# Patient Record
Sex: Female | Born: 1970
Health system: Southern US, Community
[De-identification: ages and names within clinical notes are randomized; demographics above are authoritative.]

## PROBLEM LIST (undated history)

## (undated) DIAGNOSIS — D251 Intramural leiomyoma of uterus: Secondary | ICD-10-CM

## (undated) DIAGNOSIS — D649 Anemia, unspecified: Secondary | ICD-10-CM

## (undated) DIAGNOSIS — N920 Excessive and frequent menstruation with regular cycle: Secondary | ICD-10-CM

## (undated) HISTORY — PX: TUBAL LIGATION: SHX77

---

## 2001-12-22 ENCOUNTER — Emergency Department (HOSPITAL_COMMUNITY): Admission: EM | Admit: 2001-12-22 | Discharge: 2001-12-23 | Payer: Self-pay | Admitting: *Deleted

## 2008-11-22 ENCOUNTER — Emergency Department (HOSPITAL_COMMUNITY): Admission: EM | Admit: 2008-11-22 | Discharge: 2008-11-22 | Payer: Self-pay | Admitting: Emergency Medicine

## 2009-03-08 ENCOUNTER — Emergency Department: Payer: Self-pay | Admitting: Emergency Medicine

## 2009-06-18 ENCOUNTER — Emergency Department: Payer: Self-pay | Admitting: Emergency Medicine

## 2013-07-03 ENCOUNTER — Emergency Department: Payer: Self-pay | Admitting: Emergency Medicine

## 2016-11-25 ENCOUNTER — Encounter (HOSPITAL_COMMUNITY): Payer: Self-pay

## 2016-11-25 ENCOUNTER — Emergency Department (HOSPITAL_COMMUNITY)
Admission: EM | Admit: 2016-11-25 | Discharge: 2016-11-25 | Disposition: A | Discharge status: Home / Self Care | Payer: PRIVATE HEALTH INSURANCE | Attending: Emergency Medicine | Admitting: Emergency Medicine

## 2016-11-25 DIAGNOSIS — Z7721 Contact with and (suspected) exposure to potentially hazardous body fluids: Secondary | ICD-10-CM | POA: Insufficient documentation

## 2016-11-25 DIAGNOSIS — W461XXA Contact with contaminated hypodermic needle, initial encounter: Secondary | ICD-10-CM | POA: Diagnosis not present

## 2016-11-25 DIAGNOSIS — S61432A Puncture wound without foreign body of left hand, initial encounter: Secondary | ICD-10-CM | POA: Diagnosis not present

## 2016-11-25 NOTE — Discharge Instructions (Signed)
[facility] for further management of your recent needlestick injury

## 2016-11-25 NOTE — ED Triage Notes (Signed)
[facility]. Stuck on top of right hand, NAD.

## 2016-11-25 NOTE — ED Notes (Signed)
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 Sent to ED for treatment.

## 2016-11-25 NOTE — ED Provider Notes (Signed)
[facility] Provider Note   CSN: [id number] Arrival date & time: 11/25/16  0957     History   Chief Complaint No chief complaint on file.   HPI Katie Padilla is a 46 y.o. female.  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 Patient was pulling out trash from the trash bin and an uncapped insulin needle stuck to top of her R dominant hand as she was turning. She felt a slight pinch.  Then discovered the needle.  She did draw a small drop of blood after careful inspection of her hand.  Incident happened 4 hrs ago.  She is UTD with tetanus.  Patient states she was told by her supervisor to follow-up at urgent care for further management. She mentioned at urgent care is not open, therefore she is here in the ER. She denies any significant pain. She has no other complaint.   History reviewed. No pertinent past medical history.  There are no active problems to display for this patient.   History reviewed. No pertinent surgical history.  OB History    No data available       Home Medications    Prior to Admission medications   Not on File    Family History No family history on file.  Social History Social History  Substance Use Topics  . Smoking status: Never Smoker  . Smokeless tobacco: Never Used  . Alcohol use Not on file     Allergies   Patient has no known allergies.   Review of Systems Review of Systems  Constitutional: Negative for fever.  Skin: Positive for wound.  Neurological: Negative for numbness.     Physical Exam Updated Vital Signs BP 130/77 (BP Location: Left Arm)   Pulse 64   Temp 98.1 F (36.7 C) (Oral)   Resp 16   SpO2 100%   Physical Exam  Constitutional: She appears well-developed and well-nourished. No distress.  HENT:  Head: Atraumatic.  Eyes: Conjunctivae are normal.  Neck: Neck supple.  Neurological: She is alert.    Skin: No rash noted.  R hand: on the dorsum of the hand there is a 1mm superficial puncture wound without tenderness or erythema.    Psychiatric: She has a normal mood and affect.  Nursing note and vitals reviewed.    ED Treatments / Results  Labs (all labs ordered are listed, but only abnormal results are displayed) Labs Reviewed - No data to display  EKG  EKG Interpretation None       Radiology No results found.  Procedures Procedures (including critical care time)  Medications Ordered in ED Medications - No data to display   Initial Impression / Assessment and Plan / ED Course  I have reviewed the triage vital signs and the nursing notes.  Pertinent labs & imaging results that were available during my care of the patient were reviewed by me and considered in my medical decision making (see chart for details).     BP 130/77 (BP Location: Left Arm)   Pulse 64   Temp 98.1 F (36.7 C) (Oral)   Resp 16   SpO2 100%    Final Clinical Impressions(s) / ED Diagnoses   Final diagnoses:  Accidental needlestick injury with exposure to body fluid    New Prescriptions New Prescriptions   No medications on file   10:54 AM Pt suffered a needlestick injury to  her R hand.  I have reached out to our Charge Nurse who was aware of pt and was given recommendation for pt to f/u at [facility] on Monday at 8am for further management.  No blood draws indicated while in the ER.  No further treatment indicated at this time.  Pt made aware and agrees.    Fayrene Helper, PA-C 11/25/16 1056    Arby Barrette, MD 11/25/16 1101

## 2017-06-27 DIAGNOSIS — Z1389 Encounter for screening for other disorder: Secondary | ICD-10-CM | POA: Diagnosis not present

## 2017-06-27 DIAGNOSIS — M25561 Pain in right knee: Secondary | ICD-10-CM | POA: Diagnosis not present

## 2017-06-27 DIAGNOSIS — N938 Other specified abnormal uterine and vaginal bleeding: Secondary | ICD-10-CM | POA: Diagnosis not present

## 2017-06-27 DIAGNOSIS — I10 Essential (primary) hypertension: Secondary | ICD-10-CM | POA: Diagnosis not present

## 2017-06-27 DIAGNOSIS — F330 Major depressive disorder, recurrent, mild: Secondary | ICD-10-CM | POA: Diagnosis not present

## 2017-06-28 ENCOUNTER — Other Ambulatory Visit (HOSPITAL_COMMUNITY): Payer: Self-pay | Admitting: Family Medicine

## 2017-06-28 ENCOUNTER — Ambulatory Visit (HOSPITAL_COMMUNITY)
Admission: RE | Admit: 2017-06-28 | Discharge: 2017-06-28 | Disposition: A | Discharge status: Home / Self Care | Payer: 59 | Source: Ambulatory Visit | Attending: Family Medicine | Admitting: Family Medicine

## 2017-06-28 DIAGNOSIS — G8929 Other chronic pain: Secondary | ICD-10-CM | POA: Insufficient documentation

## 2017-06-28 DIAGNOSIS — M25561 Pain in right knee: Secondary | ICD-10-CM

## 2017-08-09 DIAGNOSIS — I10 Essential (primary) hypertension: Secondary | ICD-10-CM | POA: Diagnosis not present

## 2017-08-09 DIAGNOSIS — F330 Major depressive disorder, recurrent, mild: Secondary | ICD-10-CM | POA: Diagnosis not present

## 2017-08-09 DIAGNOSIS — Z0000 Encounter for general adult medical examination without abnormal findings: Secondary | ICD-10-CM | POA: Diagnosis not present

## 2017-08-09 DIAGNOSIS — M25561 Pain in right knee: Secondary | ICD-10-CM | POA: Diagnosis not present

## 2017-08-09 DIAGNOSIS — N924 Excessive bleeding in the premenopausal period: Secondary | ICD-10-CM | POA: Diagnosis not present

## 2017-09-29 ENCOUNTER — Encounter (HOSPITAL_COMMUNITY): Payer: Self-pay

## 2017-09-29 ENCOUNTER — Other Ambulatory Visit: Payer: Self-pay

## 2017-09-29 ENCOUNTER — Emergency Department (HOSPITAL_COMMUNITY)
Admission: EM | Admit: 2017-09-29 | Discharge: 2017-09-29 | Disposition: A | Discharge status: Home / Self Care | Payer: 59 | Attending: Emergency Medicine | Admitting: Emergency Medicine

## 2017-09-29 DIAGNOSIS — R51 Headache: Secondary | ICD-10-CM | POA: Insufficient documentation

## 2017-09-29 DIAGNOSIS — Z5321 Procedure and treatment not carried out due to patient leaving prior to being seen by health care provider: Secondary | ICD-10-CM | POA: Diagnosis not present

## 2017-09-29 NOTE — ED Triage Notes (Signed)
Pt reports having headache x 1 week. OTC medication not helping. Pt says she thinks her BP is up and HR is high as well. Pt has not checked either of these. Pt vitals are normal in triage.

## 2017-10-02 NOTE — ED Notes (Signed)
Follow up call complete  Katie Padilla gone "thanks very much for calling"   1050   10/02/17  s Rimsha Trembley rn

## 2018-01-24 DIAGNOSIS — R233 Spontaneous ecchymoses: Secondary | ICD-10-CM | POA: Diagnosis not present

## 2018-01-24 DIAGNOSIS — Z1389 Encounter for screening for other disorder: Secondary | ICD-10-CM | POA: Diagnosis not present

## 2018-01-24 DIAGNOSIS — Z113 Encounter for screening for infections with a predominantly sexual mode of transmission: Secondary | ICD-10-CM | POA: Diagnosis not present

## 2018-08-01 ENCOUNTER — Other Ambulatory Visit: Payer: Self-pay

## 2018-08-01 ENCOUNTER — Emergency Department (HOSPITAL_COMMUNITY)
Admission: EM | Admit: 2018-08-01 | Discharge: 2018-08-01 | Disposition: A | Discharge status: Home / Self Care | Payer: 59 | Attending: Emergency Medicine | Admitting: Emergency Medicine

## 2018-08-01 DIAGNOSIS — Y92511 Restaurant or cafe as the place of occurrence of the external cause: Secondary | ICD-10-CM | POA: Diagnosis not present

## 2018-08-01 DIAGNOSIS — Y999 Unspecified external cause status: Secondary | ICD-10-CM | POA: Insufficient documentation

## 2018-08-01 DIAGNOSIS — T18128A Food in esophagus causing other injury, initial encounter: Secondary | ICD-10-CM | POA: Diagnosis not present

## 2018-08-01 DIAGNOSIS — Z87891 Personal history of nicotine dependence: Secondary | ICD-10-CM | POA: Diagnosis not present

## 2018-08-01 DIAGNOSIS — X58XXXA Exposure to other specified factors, initial encounter: Secondary | ICD-10-CM | POA: Diagnosis not present

## 2018-08-01 DIAGNOSIS — Y939 Activity, unspecified: Secondary | ICD-10-CM | POA: Insufficient documentation

## 2018-08-01 NOTE — ED Provider Notes (Signed)
[facility] EMERGENCY DEPARTMENT Provider Note   CSN: [id number] Arrival date & time: 08/01/18  1754    History   Chief Complaint No chief complaint on file.   HPI Karelly L Sancho is a 48 y.o. female.     48 year old female who presents with food impaction in esophagus.  Just prior to arrival, the patient was downstairs in the cafeteria eating Japanese food including chicken and broccoli.  She swallowed a bite of chicken and broccoli and felt like it got hung up in her esophagus.  She asked someone nearby to do the Heimlich maneuver on her.  She states that she was breathing normally during this time and she denies any shortness of breath.  She then try to swallow water which did not go down and it made the bolus of food move upwards.  She was then able to gag herself and vomit and she brought up a piece of chicken and broccoli.  Now she states she feels much better and no longer feels the globus sensation.  She denies any other complaints.  This has never happened before.  The history is provided by the patient.    No past medical history on file.  There are no active problems to display for this patient.   Past Surgical History:  Procedure Laterality Date  . TUBAL LIGATION       OB History   No obstetric history on file.      Home Medications    Prior to Admission medications   Not on File    Family History No family history on file.  Social History Social History   Tobacco Use  . Smoking status: Former Smoker    Packs/day: 0.50    Years: 5.00    Pack years: 2.50    Types: Cigarettes  . Smokeless tobacco: Never Used  Substance Use Topics  . Alcohol use: Not on file    Comment: occ  . Drug use: Not on file     Allergies   Patient has no known allergies.   Review of Systems Review of Systems All other systems reviewed and are negative except that which was mentioned in HPI   Physical Exam Updated Vital Signs BP (!)  139/96 (BP Location: Right Arm)   Pulse 89   Resp 18   SpO2 99%   Physical Exam Vitals signs and nursing note reviewed.  Constitutional:      General: She is not in acute distress.    Appearance: She is well-developed.  HENT:     Head: Normocephalic and atraumatic.     Nose: Nose normal.     Mouth/Throat:     Mouth: Mucous membranes are moist.     Pharynx: Oropharynx is clear.  Eyes:     Conjunctiva/sclera: Conjunctivae normal.  Neck:     Musculoskeletal: Neck supple.  Cardiovascular:     Rate and Rhythm: Normal rate and regular rhythm.     Heart sounds: No murmur.  Pulmonary:     Effort: Pulmonary effort is normal.     Breath sounds: Normal breath sounds.  Skin:    General: Skin is warm and dry.  Neurological:     Mental Status: She is alert and oriented to person, place, and time.  Psychiatric:        Judgment: Judgment normal.      ED Treatments / Results  Labs (all labs ordered are listed, but only abnormal results are displayed) Labs Reviewed - No  data to display  EKG None  Radiology No results found.  Procedures Procedures (including critical care time)  Medications Ordered in ED Medications - No data to display   Initial Impression / Assessment and Plan / ED Course  I have reviewed the triage vital signs and the nursing notes.        Well-appearing, reassuring vital signs.  Patient states that symptoms have essentially resolved since being able to bring up bolus of food.  She was able to drink Coke in the ED with no problems and remains well-appearing on reassessment.  Discussed supportive measures including soft diet for the next few days and follow-up with PCP if she has any ongoing pain with swallowing.  Return precautions reviewed. Final Clinical Impressions(s) / ED Diagnoses   Final diagnoses:  Food impaction of esophagus, initial encounter    ED Discharge Orders    None       , Ambrose Finland, MD 08/01/18 1924

## 2018-08-01 NOTE — ED Triage Notes (Signed)
Pt downstairs in cafeteria eating chicken when a piece became lodged in her throat.  Upon arrival pt calm and protecting airway.

## 2018-09-24 DIAGNOSIS — B351 Tinea unguium: Secondary | ICD-10-CM | POA: Diagnosis not present

## 2018-11-11 ENCOUNTER — Other Ambulatory Visit: Payer: Self-pay

## 2018-11-11 ENCOUNTER — Ambulatory Visit
Admission: EM | Admit: 2018-11-11 | Discharge: 2018-11-11 | Disposition: A | Discharge status: Home / Self Care | Payer: 59 | Attending: Physician Assistant | Admitting: Physician Assistant

## 2018-11-11 DIAGNOSIS — L0201 Cutaneous abscess of face: Secondary | ICD-10-CM | POA: Diagnosis not present

## 2018-11-11 MED ORDER — SULFAMETHOXAZOLE-TRIMETHOPRIM 800-160 MG PO TABS: 1.0000 | ORAL_TABLET | Freq: Two times a day (BID) | ORAL | 0 refills | Status: AC

## 2018-11-11 NOTE — Discharge Instructions (Signed)
Warm compresses 20 minutes 4 times a day  

## 2018-11-11 NOTE — ED Provider Notes (Signed)
[facility]    CSN: [id number] Arrival date & time: 11/11/18  1019      History   Chief Complaint Chief Complaint  Patient presents with  . Abscess    HPI Katie Padilla is a 48 y.o. female.   The history is provided by the patient. No language interpreter was used.  Abscess Location:  Face Facial abscess location:  Face Size:  1 Abscess quality: painful, redness and warmth   Pain details:    Quality:  Aching   Severity:  Moderate   Timing:  Constant   Progression:  Worsening Relieved by:  Nothing Worsened by:  Nothing Ineffective treatments:  None tried Associated symptoms: no vomiting   Pt complains of an abscess that drained on the right side of her face.  Pt complains of swelling behind ear  History reviewed. No pertinent past medical history.  There are no active problems to display for this patient.   Past Surgical History:  Procedure Laterality Date  . TUBAL LIGATION      OB History   No obstetric history on file.      Home Medications    Prior to Admission medications   Medication Sig Start Date End Date Taking? Authorizing Provider  sulfamethoxazole-trimethoprim (BACTRIM DS) 800-160 MG tablet Take 1 tablet by mouth 2 (two) times daily for 10 days. 11/11/18 11/21/18  Fransico Meadow, PA-C    Family History History reviewed. No pertinent family history.  Social History Social History   Tobacco Use  . Smoking status: Former Smoker    Packs/day: 0.50    Years: 5.00    Pack years: 2.50    Types: Cigarettes  . Smokeless tobacco: Never Used  Substance Use Topics  . Alcohol use: Not on file    Comment: occ  . Drug use: Not on file     Allergies   Patient has no known allergies.   Review of Systems Review of Systems  HENT: Positive for ear pain.   Gastrointestinal: Negative for vomiting.  All other systems reviewed and are negative.    Physical Exam Triage Vital Signs ED Triage Vitals  Enc Vitals Group     BP  11/11/18 1033 137/88     Pulse Rate 11/11/18 1033 65     Resp 11/11/18 1033 20     Temp 11/11/18 1033 97.9 F (36.6 C)     Temp src --      SpO2 11/11/18 1033 97 %     Weight --      Height --      Head Circumference --      Peak Flow --      Pain Score 11/11/18 1030 0     Pain Loc --      Pain Edu? --      Excl. in [place]? --    No data found.  Updated Vital Signs BP 137/88   Pulse 65   Temp 97.9 F (36.6 C)   Resp 20   LMP 11/06/2018   SpO2 97%   Visual Acuity Right Eye Distance:   Left Eye Distance:   Bilateral Distance:    Right Eye Near:   Left Eye Near:    Bilateral Near:     Physical Exam Vitals signs and nursing note reviewed.  HENT:     Head: Normocephalic.     Comments: Swelling behind right ear, 2cm area of swelling, pustule,  Skin:    General: Skin is warm.  Neurological:     General: No focal deficit present.  Psychiatric:        Mood and Affect: Mood normal.      UC Treatments / Results  Labs (all labs ordered are listed, but only abnormal results are displayed) Labs Reviewed - No data to display  EKG   Radiology No results found.  Procedures Procedures (including critical care time)  Medications Ordered in UC Medications - No data to display  Initial Impression / Assessment and Plan / UC Course  I have reviewed the triage vital signs and the nursing notes.  Pertinent labs & imaging results that were available during my care of the patient were reviewed by me and considered in my medical decision making (see chart for details).     Pt advised warm compresses, Pt given Rx for bactrim  Final Clinical Impressions(s) / UC Diagnoses   Final diagnoses:  Facial abscess     Discharge Instructions     Warm compresses 20 minutes 4 times a day   ED Prescriptions    Medication Sig Dispense Auth. Provider   sulfamethoxazole-trimethoprim (BACTRIM DS) 800-160 MG tablet Take 1 tablet by mouth 2 (two) times daily for 10 days. 20  tablet Elson AreasSofia,  K, [place]C     Controlled Substance Prescriptions [place] Controlled Substance Registry consulted? Not Applicable  An After Visit Summary was printed and given to the patient.    Elson AreasSofia,  K, [place]C 11/11/18 1309

## 2018-11-11 NOTE — ED Triage Notes (Signed)
Pt has small abscess or cyst behind right ear for past 2 weeks

## 2018-11-29 ENCOUNTER — Ambulatory Visit
Admission: EM | Admit: 2018-11-29 | Discharge: 2018-11-29 | Disposition: A | Discharge status: Home / Self Care | Payer: 59 | Attending: Physician Assistant | Admitting: Physician Assistant

## 2018-11-29 ENCOUNTER — Other Ambulatory Visit: Payer: Self-pay

## 2018-11-29 DIAGNOSIS — D649 Anemia, unspecified: Secondary | ICD-10-CM

## 2018-11-29 DIAGNOSIS — R591 Generalized enlarged lymph nodes: Secondary | ICD-10-CM

## 2018-11-29 DIAGNOSIS — Z7689 Persons encountering health services in other specified circumstances: Secondary | ICD-10-CM | POA: Diagnosis not present

## 2018-11-29 LAB — BASIC METABOLIC PANEL
BUN/Creatinine Ratio: 10 (ref 9–23)
BUN: 10 mg/dL (ref 6–24)
CO2: 22 mmol/L (ref 20–29)
Calcium: 9.3 mg/dL (ref 8.7–10.2)
Chloride: 106 mmol/L (ref 96–106)
Creatinine, Ser: 0.96 mg/dL (ref 0.57–1.00)
GFR calc Af Amer: 82 mL/min/{1.73_m2} (ref >59)
GFR calc non Af Amer: 71 mL/min/{1.73_m2} (ref >59)
Glucose: 118 mg/dL — ABNORMAL HIGH (ref 65–99)
Potassium: 4.2 mmol/L (ref 3.5–5.2)
Sodium: 142 mmol/L (ref 134–144)

## 2018-11-29 LAB — CBC WITH DIFFERENTIAL/PLATELET
Basophils Absolute: 0.1 10*3/uL (ref 0.0–0.2)
Basos: 1 %
EOS (ABSOLUTE): 0.2 10*3/uL (ref 0.0–0.4)
Eos: 3 %
Hematocrit: 32.8 % — ABNORMAL LOW (ref 34.0–46.6)
Hemoglobin: 9.8 g/dL — ABNORMAL LOW (ref 11.1–15.9)
Immature Grans (Abs): 0 10*3/uL (ref 0.0–0.1)
Immature Granulocytes: 0 %
Lymphocytes Absolute: 2.1 10*3/uL (ref 0.7–3.1)
Lymphs: 33 %
MCH: 21.9 pg — ABNORMAL LOW (ref 26.6–33.0)
MCHC: 29.9 g/dL — ABNORMAL LOW (ref 31.5–35.7)
MCV: 73 fL — ABNORMAL LOW (ref 79–97)
Monocytes Absolute: 0.4 10*3/uL (ref 0.1–0.9)
Monocytes: 6 %
Neutrophils Absolute: 3.6 10*3/uL (ref 1.4–7.0)
Neutrophils: 57 %
Platelets: 326 10*3/uL (ref 150–450)
RBC: 4.47 x10E6/uL (ref 3.77–5.28)
RDW: 15.7 % — ABNORMAL HIGH (ref 11.7–15.4)
WBC: 6.4 10*3/uL (ref 3.4–10.8)

## 2018-11-29 NOTE — Discharge Instructions (Signed)
Schedule appointment to see your MD next week

## 2018-11-29 NOTE — ED Triage Notes (Signed)
Pt presents with complaints of continued knot behind her right ear x 1 month. Pt was seen here the beginning of the month and was diagnosed with abscess and treated with antibiotics. Pt reports taking all of the medication. Reports continued pain.

## 2018-11-29 NOTE — ED Provider Notes (Addendum)
[facility]    CSN: [id number] Arrival date & time: 11/29/18  1135      History   Chief Complaint Chief Complaint  Patient presents with  . Cyst    HPI Katie Padilla is a 48 y.o. female.   Pt complains of soreness in the right side of her neck  I saw pt earlier this month for right sided lymphadenopathy.  Pt reports swelling has resolved but she still has swelling and pain in the area behind her right ear and down her neck.  Pt has a broken tooth upper mouth, but no infection or dental pain   The history is provided by the patient. No language interpreter was used.    History reviewed. No pertinent past medical history.  There are no active problems to display for this patient.   Past Surgical History:  Procedure Laterality Date  . TUBAL LIGATION      OB History   No obstetric history on file.      Home Medications    Prior to Admission medications   Not on File    Family History Family History  Problem Relation Age of Onset  . Heart failure Mother   . Hypertension Mother   . Diabetes Mother   . Healthy Father     Social History Social History   Tobacco Use  . Smoking status: Former Smoker    Packs/day: 0.50    Years: 5.00    Pack years: 2.50    Types: Cigarettes  . Smokeless tobacco: Never Used  Substance Use Topics  . Alcohol use: Not on file    Comment: occ  . Drug use: Not on file     Allergies   Patient has no known allergies.   Review of Systems Review of Systems  All other systems reviewed and are negative.    Physical Exam Triage Vital Signs ED Triage Vitals [11/29/18 1147]  Enc Vitals Group     BP 128/81     Pulse Rate 74     Resp 18     Temp 98.8 F (37.1 C)     Temp src      SpO2 99 %     Weight      Height      Head Circumference      Peak Flow      Pain Score 2     Pain Loc      Pain Edu?      Excl. in [place]?    No data found.  Updated Vital Signs BP 128/81   Pulse 74   Temp 98.8 F  (37.1 C)   Resp 18   LMP 11/06/2018   SpO2 99%   Visual Acuity Right Eye Distance:   Left Eye Distance:   Bilateral Distance:    Right Eye Near:   Left Eye Near:    Bilateral Near:     Physical Exam Vitals signs and nursing note reviewed.  Constitutional:      Appearance: Normal appearance. She is well-developed.  HENT:     Head: Normocephalic.     Right Ear: Tympanic membrane normal.     Left Ear: Tympanic membrane normal.     Nose: Nose normal.     Mouth/Throat:     Mouth: Mucous membranes are moist.  Eyes:     Pupils: Pupils are equal, round, and reactive to light.  Neck:     Musculoskeletal: Normal range of motion.  Cardiovascular:  Rate and Rhythm: Normal rate.  Pulmonary:     Effort: Pulmonary effort is normal.  Abdominal:     General: There is no distension.  Musculoskeletal: Normal range of motion.  Skin:    General: Skin is warm.  Neurological:     General: No focal deficit present.     Mental Status: She is alert and oriented to person, place, and time.  Psychiatric:        Mood and Affect: Mood normal.      UC Treatments / Results  Labs (all labs ordered are listed, but only abnormal results are displayed) Labs Reviewed  CBC WITH DIFFERENTIAL/PLATELET - Abnormal; Notable for the following components:      Result Value   Hemoglobin 9.8 (*)    Hematocrit 32.8 (*)    MCV 73 (*)    MCH 21.9 (*)    MCHC 29.9 (*)    RDW 15.7 (*)    All other components within normal limits   Narrative:    Performed at:  [address] - [facility] [address], [place], [place]  [id number] Lab Director: Jolene SchimkeSanjai Nagendra MD, Phone:  [phone]  BASIC METABOLIC PANEL - Abnormal; Notable for the following components:   Glucose 118 (*)    All other components within normal limits   Narrative:    Performed at:  [address] - [facility] [place] [address], [place], [place]  [id number] Lab Director: Jolene SchimkeSanjai Nagendra MD, Phone:  [phone]    EKG   Radiology No results  found.  Procedures Procedures (including critical care time)  Medications Ordered in UC Medications - No data to display  Initial Impression / Assessment and Plan / UC Course  I have reviewed the triage vital signs and the nursing notes.  Pertinent labs & imaging results that were available during my care of the patient were reviewed by me and considered in my medical decision making (see chart for details).     MDM   I am going to order labs on pt.   Pt advised labs will not return today.  Pt advised to continue warm compresses.  I do not think antibiotics are needed.   Pt called with results.  I advised pt to take a multivitamin with iron.  Pt advised to see her MD for recheck.  Pt advised normal wbc's  I doubt any type of lymphadic disorder   Final Clinical Impressions(s) / UC Diagnoses   Final diagnoses:  Lymphadenopathy  Anemia, unspecified type     Discharge Instructions     Schedule appointment to see your MD next week      ED Prescriptions    None     Controlled Substance Prescriptions [place] Controlled Substance Registry consulted? Not Applicable  An After Visit Summary was printed and given to the patient.    Elson AreasSofia, Jailon Schaible K, PA-C 11/29/18 1210    Elson AreasSofia, Brecken Walth K, [place] 11/29/18 1601

## 2019-01-16 DIAGNOSIS — Z124 Encounter for screening for malignant neoplasm of cervix: Secondary | ICD-10-CM | POA: Diagnosis not present

## 2019-01-16 DIAGNOSIS — I10 Essential (primary) hypertension: Secondary | ICD-10-CM | POA: Diagnosis not present

## 2019-01-16 DIAGNOSIS — Z1389 Encounter for screening for other disorder: Secondary | ICD-10-CM | POA: Diagnosis not present

## 2019-01-16 DIAGNOSIS — Z0000 Encounter for general adult medical examination without abnormal findings: Secondary | ICD-10-CM | POA: Diagnosis not present

## 2019-01-16 DIAGNOSIS — N924 Excessive bleeding in the premenopausal period: Secondary | ICD-10-CM | POA: Diagnosis not present

## 2019-05-09 DIAGNOSIS — U071 COVID-19: Secondary | ICD-10-CM

## 2019-05-09 HISTORY — DX: COVID-19: U07.1

## 2019-07-21 DIAGNOSIS — S0501XA Injury of conjunctiva and corneal abrasion without foreign body, right eye, initial encounter: Secondary | ICD-10-CM | POA: Diagnosis not present

## 2019-07-24 DIAGNOSIS — S0501XD Injury of conjunctiva and corneal abrasion without foreign body, right eye, subsequent encounter: Secondary | ICD-10-CM | POA: Diagnosis not present

## 2019-10-01 DIAGNOSIS — H52223 Regular astigmatism, bilateral: Secondary | ICD-10-CM | POA: Diagnosis not present

## 2019-10-01 DIAGNOSIS — H524 Presbyopia: Secondary | ICD-10-CM | POA: Diagnosis not present

## 2019-10-01 DIAGNOSIS — H5203 Hypermetropia, bilateral: Secondary | ICD-10-CM | POA: Diagnosis not present

## 2019-12-29 DIAGNOSIS — Z1389 Encounter for screening for other disorder: Secondary | ICD-10-CM | POA: Diagnosis not present

## 2019-12-29 DIAGNOSIS — F17200 Nicotine dependence, unspecified, uncomplicated: Secondary | ICD-10-CM | POA: Diagnosis not present

## 2019-12-29 DIAGNOSIS — E669 Obesity, unspecified: Secondary | ICD-10-CM | POA: Diagnosis not present

## 2019-12-29 DIAGNOSIS — Z0000 Encounter for general adult medical examination without abnormal findings: Secondary | ICD-10-CM | POA: Diagnosis not present

## 2019-12-29 DIAGNOSIS — I10 Essential (primary) hypertension: Secondary | ICD-10-CM | POA: Diagnosis not present

## 2019-12-29 DIAGNOSIS — R7309 Other abnormal glucose: Secondary | ICD-10-CM | POA: Diagnosis not present

## 2019-12-29 DIAGNOSIS — N760 Acute vaginitis: Secondary | ICD-10-CM | POA: Diagnosis not present

## 2019-12-31 DIAGNOSIS — N760 Acute vaginitis: Secondary | ICD-10-CM | POA: Diagnosis not present

## 2020-02-15 ENCOUNTER — Ambulatory Visit
Admission: EM | Admit: 2020-02-15 | Discharge: 2020-02-15 | Disposition: A | Discharge status: Home / Self Care | Payer: 59 | Attending: Emergency Medicine | Admitting: Emergency Medicine

## 2020-02-15 ENCOUNTER — Other Ambulatory Visit: Payer: Self-pay

## 2020-02-15 ENCOUNTER — Encounter: Payer: Self-pay | Admitting: Emergency Medicine

## 2020-02-15 DIAGNOSIS — J069 Acute upper respiratory infection, unspecified: Secondary | ICD-10-CM

## 2020-02-15 DIAGNOSIS — Z1152 Encounter for screening for COVID-19: Secondary | ICD-10-CM

## 2020-02-15 DIAGNOSIS — R110 Nausea: Secondary | ICD-10-CM

## 2020-02-15 MED ORDER — FLUTICASONE PROPIONATE 50 MCG/ACT NA SUSP: 1.0000 | Freq: Every day | NASAL | 0 refills | Status: DC

## 2020-02-15 MED ORDER — ONDANSETRON 4 MG PO TBDP: 4.0000 mg | ORAL_TABLET | Freq: Three times a day (TID) | ORAL | 0 refills | Status: DC | PRN

## 2020-02-15 MED ORDER — BENZONATATE 100 MG PO CAPS: 100.0000 mg | ORAL_CAPSULE | Freq: Three times a day (TID) | ORAL | 0 refills | Status: DC

## 2020-02-15 MED ORDER — CETIRIZINE HCL 10 MG PO TABS: 10.0000 mg | ORAL_TABLET | Freq: Every day | ORAL | 0 refills | Status: DC

## 2020-02-15 MED ORDER — DEXAMETHASONE 4 MG PO TABS: 4.0000 mg | ORAL_TABLET | Freq: Every day | ORAL | 0 refills | Status: AC

## 2020-02-15 NOTE — ED Triage Notes (Signed)
Sinus drainage since last Friday.  States it drains so much it has made her vomit a few times.

## 2020-02-15 NOTE — Discharge Instructions (Signed)
COVID testing ordered.  It will take between 2-7 days for test results.  Someone will contact you regarding abnormal results.    In the meantime: You should remain isolated in your home for 10 days from symptom onset AND greater than 24 hours after symptoms resolution (absence of fever without the use of fever-reducing medication and improvement in respiratory symptoms), whichever is longer Get plenty of rest and push fluids Tessalon Perles prescribed for cough zyrtec for nasal congestion, runny nose, and/or sore throat Flonase for nasal congestion and runny nose Decadron was prescribed Zofran was prescribed for nausea Use medications daily for symptom relief Use OTC medications like ibuprofen or tylenol as needed fever or pain Call or go to the ED if you have any new or worsening symptoms such as fever, worsening cough, shortness of breath, chest tightness, chest pain, turning blue, changes in mental status, etc..Marland Kitchen

## 2020-02-15 NOTE — ED Provider Notes (Signed)
[facility]   [id number] 02/15/20 Arrival Time: 1151   CC: COVID symptoms  SUBJECTIVE: History from: patient.  Katie Padilla is a 49 y.o. female  who presented to the urgent care with a complaint of cough, nasal congestion, postnasal drip d and nausea for the past 2 to 3 days.  Denies sick exposure to COVID, flu or strep.  Denies recent travel.  Has tried Mucinex without relief.  Denies aggravating factors.  Denies previous symptoms in the past.   Denies fever, chills, fatigue, sinus pain, rhinorrhea, sore throat, SOB, wheezing, chest pain, nausea, changes in bowel or bladder habits.      ROS: As per HPI.  All other pertinent ROS negative.     History reviewed. No pertinent past medical history. Past Surgical History:  Procedure Laterality Date  . TUBAL LIGATION     No Known Allergies No current facility-administered medications on file prior to encounter.   No current outpatient medications on file prior to encounter.   Social History   Socioeconomic History  . Marital status: Single    Spouse name: Not on file  . Number of children: Not on file  . Years of education: Not on file  . Highest education level: Not on file  Occupational History  . Not on file  Tobacco Use  . Smoking status: Former Smoker    Packs/day: 0.50    Years: 5.00    Pack years: 2.50    Types: Cigarettes  . Smokeless tobacco: Never Used  Substance and Sexual Activity  . Alcohol use: Not on file    Comment: occ  . Drug use: Not on file  . Sexual activity: Not on file  Other Topics Concern  . Not on file  Social History Narrative  . Not on file   Social Determinants of Health   Financial Resource Strain:   . Difficulty of Paying Living Expenses: Not on file  Food Insecurity:   . Worried About Programme researcher, broadcasting/film/video in the Last Year: Not on file  . Ran Out of Food in the Last Year: Not on file  Transportation Needs:   . Lack of Transportation (Medical): Not on file  .  Lack of Transportation (Non-Medical): Not on file  Physical Activity:   . Days of Exercise per Week: Not on file  . Minutes of Exercise per Session: Not on file  Stress:   . Feeling of Stress : Not on file  Social Connections:   . Frequency of Communication with Friends and Family: Not on file  . Frequency of Social Gatherings with Friends and Family: Not on file  . Attends Religious Services: Not on file  . Active Member of Clubs or Organizations: Not on file  . Attends Banker Meetings: Not on file  . Marital Status: Not on file  Intimate Partner Violence:   . Fear of Current or Ex-Partner: Not on file  . Emotionally Abused: Not on file  . Physically Abused: Not on file  . Sexually Abused: Not on file   Family History  Problem Relation Age of Onset  . Heart failure Mother   . Hypertension Mother   . Diabetes Mother   . Healthy Father     OBJECTIVE:  Vitals:   02/15/20 1217 02/15/20 1220  BP: 98/66   Pulse: 87   Resp: 16   Temp: 98.4 F (36.9 C)   TempSrc: Oral   SpO2: 97%   Weight:  225 lb (102.1 kg)  Height:  5\' 4"  (1.626 m)     General appearance: alert; appears fatigued, but nontoxic; speaking in full sentences and tolerating own secretions HEENT: NCAT; Ears: EACs clear, TMs pearly gray; Eyes: PERRL.  EOM grossly intact. Sinuses: nontender; Nose: nares patent without rhinorrhea, Throat: oropharynx clear, tonsils non erythematous or enlarged, uvula midline  Neck: supple without LAD Lungs: unlabored respirations, symmetrical air entry; cough: moderate; no respiratory distress; CTAB Heart: regular rate and rhythm.  Radial pulses 2+ symmetrical bilaterally Skin: warm and dry Psychological: alert and cooperative; normal mood and affect  LABS:  No results found for this or any previous visit (from the past 24 hour(s)).   ASSESSMENT & PLAN:  1. Encounter for screening for COVID-19   2. URI with cough and congestion   3. Nausea without vomiting      Meds ordered this encounter  Medications  . fluticasone (FLONASE) 50 MCG/ACT nasal spray    Sig: Place 1 spray into both nostrils daily for 14 days.    Dispense:  16 g    Refill:  0  . cetirizine (ZYRTEC ALLERGY) 10 MG tablet    Sig: Take 1 tablet (10 mg total) by mouth daily.    Dispense:  30 tablet    Refill:  0  . benzonatate (TESSALON) 100 MG capsule    Sig: Take 1 capsule (100 mg total) by mouth every 8 (eight) hours.    Dispense:  30 capsule    Refill:  0  . dexamethasone (DECADRON) 4 MG tablet    Sig: Take 1 tablet (4 mg total) by mouth daily for 7 days.    Dispense:  7 tablet    Refill:  0  . ondansetron (ZOFRAN ODT) 4 MG disintegrating tablet    Sig: Take 1 tablet (4 mg total) by mouth every 8 (eight) hours as needed for nausea or vomiting.    Dispense:  20 tablet    Refill:  0   Discharge discharge instructions.    COVID testing ordered.  It will take between 2-7 days for test results.  Someone will contact you regarding abnormal results.    In the meantime: You should remain isolated in your home for 10 days from symptom onset AND greater than 24 hours after symptoms resolution (absence of fever without the use of fever-reducing medication and improvement in respiratory symptoms), whichever is longer Get plenty of rest and push fluids Tessalon Perles prescribed for cough zyrtec for nasal congestion, runny nose, and/or sore throat Flonase for nasal congestion and runny nose Decadron was prescribed Zofran was prescribed for nausea Use medications daily for symptom relief Use OTC medications like ibuprofen or tylenol as needed fever or pain Call or go to the ED if you have any new or worsening symptoms such as fever, worsening cough, shortness of breath, chest tightness, chest pain, turning blue, changes in mental status, etc...   Reviewed expectations re: course of current medical issues. Questions answered. Outlined signs and symptoms indicating need for more  acute intervention. Patient verbalized understanding. After Visit Summary given.         , FNP 02/15/20 1311

## 2020-02-17 LAB — NOVEL CORONAVIRUS, NAA: SARS-CoV-2, NAA: DETECTED — AB

## 2020-02-17 LAB — SARS-COV-2, NAA 2 DAY TAT

## 2020-02-18 ENCOUNTER — Telehealth: Payer: Self-pay | Admitting: Physician Assistant

## 2020-02-18 NOTE — Telephone Encounter (Signed)
Called to discuss with Katie Padilla about Covid symptoms and the use of  monoclonal antibody infusion for those with mild to moderate Covid symptoms and at a high risk of hospitalization.     Pt is qualified for this infusion due to co-morbid conditions and/or a member of an at-risk group, however symptoms resolved.   Symptoms reviewed as well as criteria for ending isolation.  Symptoms reviewed that would warrant ED/Hospital evaluation. Preventative practices reviewed. Patient verbalized understanding. Patient advised to call back if he decides that he does want to get infusion. Callback number to the infusion center given. Patient advised to go to Urgent care or ED with severe symptoms. Last date she would be eligible for infusion is 02/19/20. BMI > 25.      Manson Passey, PA- C

## 2021-05-27 ENCOUNTER — Ambulatory Visit
Admission: EM | Admit: 2021-05-27 | Discharge: 2021-05-27 | Disposition: A | Discharge status: Home / Self Care | Payer: BC Managed Care – PPO

## 2021-05-27 ENCOUNTER — Ambulatory Visit (INDEPENDENT_AMBULATORY_CARE_PROVIDER_SITE_OTHER): Payer: BC Managed Care – PPO

## 2021-05-27 ENCOUNTER — Other Ambulatory Visit: Payer: Self-pay

## 2021-05-27 DIAGNOSIS — M25561 Pain in right knee: Secondary | ICD-10-CM

## 2021-05-27 DIAGNOSIS — M1712 Unilateral primary osteoarthritis, left knee: Secondary | ICD-10-CM

## 2021-05-27 MED ORDER — NAPROXEN 500 MG PO TABS: 500.0000 mg | ORAL_TABLET | Freq: Two times a day (BID) | ORAL | 0 refills | Status: DC

## 2021-05-27 NOTE — ED Triage Notes (Signed)
Pt reports pain, hot to touch in the right calf x 4 days. States she can not bed the right leg, right knee is tender and sore.

## 2021-05-27 NOTE — ED Provider Notes (Signed)
[facility]   MRN: [id number] DOB: Oct 19, 1970  Subjective:   Katie Padilla is a 51 y.o. female presenting for 4 day history of acute onset right medial knee pain radiating into the top of the medial upper calf. No history of arthritis, dvt in the right lower leg.  No fall, trauma, knee buckling.  No rashes.  Patient works at [employer], does a late shift and stocking.  Stands or walks the majority of her shift.  No current facility-administered medications for this encounter.  Current Outpatient Medications:    Ascorbic Acid (VITAMIN C) 1000 MG tablet, Take 1,000 mg by mouth daily., Disp: , Rfl:    NON FORMULARY, Seamoss, Disp: , Rfl:    Turmeric (QC TUMERIC COMPLEX PO), Take by mouth., Disp: , Rfl:    VITAMIN D PO, Take by mouth., Disp: , Rfl:    cetirizine (ZYRTEC ALLERGY) 10 MG tablet, Take 1 tablet (10 mg total) by mouth daily., Disp: 30 tablet, Rfl: 0   No Known Allergies  History reviewed. No pertinent past medical history.   Past Surgical History:  Procedure Laterality Date   TUBAL LIGATION      Family History  Problem Relation Age of Onset   Heart failure Mother    Hypertension Mother    Diabetes Mother    Healthy Father     Social History   Tobacco Use   Smoking status: Former    Packs/day: 0.50    Years: 5.00    Pack years: 2.50    Types: Cigarettes   Smokeless tobacco: Never    ROS   Objective:   Vitals: BP 134/84 (BP Location: Right Arm)    Pulse 71    Temp 98.5 F (36.9 C) (Oral)    Resp 18    LMP 05/22/2021 (Exact Date)    SpO2 100%   Physical Exam Constitutional:      General: She is not in acute distress.    Appearance: Normal appearance. She is well-developed. She is obese. She is not ill-appearing, toxic-appearing or diaphoretic.     Comments: Morbid obesity.  HENT:     Head: Normocephalic and atraumatic.     Nose: Nose normal.     Mouth/Throat:     Mouth: Mucous membranes are moist.  Eyes:     General: No  scleral icterus.       Right eye: No discharge.        Left eye: No discharge.     Extraocular Movements: Extraocular movements intact.  Cardiovascular:     Rate and Rhythm: Normal rate.  Pulmonary:     Effort: Pulmonary effort is normal.  Musculoskeletal:        General: No swelling.     Right knee: Bony tenderness present. No swelling, deformity, effusion, erythema, ecchymosis, lacerations or crepitus. Decreased range of motion. Tenderness present over the medial joint line. No lateral joint line or patellar tendon tenderness. Normal alignment and normal patellar mobility.     Right lower leg: No swelling, deformity, lacerations, tenderness or bony tenderness. No edema.     Comments: Negative Homans' sign.   Skin:    General: Skin is warm and dry.     Findings: No erythema or rash.     Comments: No calf warmth, erythema.  Neurological:     General: No focal deficit present.     Mental Status: She is alert and oriented to person, place, and time.  Psychiatric:  Mood and Affect: Mood normal.        Behavior: Behavior normal.        Thought Content: Thought content normal.        Judgment: Judgment normal.    DG Knee Complete 4 Views Right  Result Date: 05/27/2021 CLINICAL DATA:  51 year old female with knee pain and swelling. EXAM: RIGHT KNEE - COMPLETE 4+ VIEW COMPARISON:  Right knee series 06/28/2017. FINDINGS: Bone mineralization is within normal limits. No evidence of joint effusion. Tricompartmental degenerative spurring has increased since 2019, but joint spaces are relatively preserved. No acute osseous abnormality identified. Soft tissue contours appear within normal limits. IMPRESSION: Increased tricompartmental degenerative spurring since 2019 with no acute osseous abnormality identified about the right knee. Electronically Signed   By: Odessa Fleming M.D.   On: 05/27/2021 09:47     Assessment and Plan :   PDMP not reviewed this encounter.  1. Osteoarthritis of left knee,  unspecified osteoarthritis type   2. Acute pain of right knee    Recommended management of her arthritis with NSAID, changing her work and lifestyle.  Patient wanted to avoid steroids for now and I am in agreement. Counseled patient on potential for adverse effects with medications prescribed/recommended today, ER and return-to-clinic precautions discussed, patient verbalized understanding.    Wallis Bamberg, PA-C 05/27/21 1009

## 2021-05-27 NOTE — Discharge Instructions (Addendum)

## 2022-07-09 ENCOUNTER — Ambulatory Visit
Admission: EM | Admit: 2022-07-09 | Discharge: 2022-07-09 | Disposition: A | Discharge status: Home / Self Care | Payer: BC Managed Care – PPO | Attending: Nurse Practitioner | Admitting: Nurse Practitioner

## 2022-07-09 ENCOUNTER — Encounter: Payer: Self-pay | Admitting: Emergency Medicine

## 2022-07-09 DIAGNOSIS — J309 Allergic rhinitis, unspecified: Secondary | ICD-10-CM

## 2022-07-09 MED ORDER — AMOXICILLIN-POT CLAVULANATE 875-125 MG PO TABS: 1.0000 | ORAL_TABLET | Freq: Two times a day (BID) | ORAL | 0 refills | Status: DC

## 2022-07-09 MED ORDER — FLUTICASONE PROPIONATE 50 MCG/ACT NA SUSP: 2.0000 | Freq: Every day | NASAL | 0 refills | Status: DC

## 2022-07-09 MED ORDER — PSEUDOEPH-BROMPHEN-DM 30-2-10 MG/5ML PO SYRP: 5.0000 mL | ORAL_SOLUTION | Freq: Four times a day (QID) | ORAL | 0 refills | Status: DC | PRN

## 2022-07-09 NOTE — ED Provider Notes (Signed)
[facility]    CSN: [id number] Arrival date & time: 07/09/22  1118      History   Chief Complaint No chief complaint on file.   HPI Katie Padilla is a 52 y.o. female.   The history is provided by the patient.   Patient presents for complaints of nasal congestion, sneezing, itchy eyes, and sore throat.  Symptoms have been present for the past 2 weeks.  Patient denies fever, chills, headache, ear pain, cough, abdominal pain, nausea, vomiting, or diarrhea.  Patient reports she has been taking over-the-counter sinus cold medication along with Robitussin for her symptoms with minimal relief.  Patient reports history of seasonal allergies.  History reviewed. No pertinent past medical history.  There are no problems to display for this patient.   Past Surgical History:  Procedure Laterality Date   TUBAL LIGATION      OB History   No obstetric history on file.      Home Medications    Prior to Admission medications   Medication Sig Start Date End Date Taking? Authorizing Provider  amoxicillin-clavulanate (AUGMENTIN) 875-125 MG tablet Take 1 tablet by mouth every 12 (twelve) hours. 07/09/22  Yes Fed Ceci-Warren, Alda Lea, NP  brompheniramine-pseudoephedrine-DM 30-2-10 MG/5ML syrup Take 5 mLs by mouth 4 (four) times daily as needed. 07/09/22  Yes Yair Dusza-Warren, Alda Lea, NP  fluticasone (FLONASE) 50 MCG/ACT nasal spray Place 2 sprays into both nostrils daily. 07/09/22  Yes Laprecious Austill-Warren, Alda Lea, NP  Ascorbic Acid (VITAMIN C) 1000 MG tablet Take 1,000 mg by mouth daily.    [provider]  cetirizine (ZYRTEC ALLERGY) 10 MG tablet Take 1 tablet (10 mg total) by mouth daily. 02/15/20   Avegno, Darrelyn Hillock, FNP  naproxen (NAPROSYN) 500 MG tablet Take 1 tablet (500 mg total) by mouth 2 (two) times daily with a meal. 05/27/21   Jaynee Eagles, PA-C  NON FORMULARY Seamoss    [provider]  Turmeric (QC TUMERIC COMPLEX PO) Take by mouth.    [provider]  VITAMIN D PO Take by mouth.    [provider]    Family History Family History  Problem Relation Age of Onset   Heart failure Mother    Hypertension Mother    Diabetes Mother    Healthy Father     Social History Social History   Tobacco Use   Smoking status: Former    Packs/day: 0.50    Years: 5.00    Total pack years: 2.50    Types: Cigarettes   Smokeless tobacco: Never     Allergies   Patient has no known allergies.   Review of Systems Review of Systems Per HPI  Physical Exam Triage Vital Signs ED Triage Vitals  Enc Vitals Group     BP 07/09/22 1132 (!) 151/103     Pulse Rate 07/09/22 1132 73     Resp 07/09/22 1132 18     Temp 07/09/22 1132 98.1 F (36.7 C)     Temp Source 07/09/22 1132 Oral     SpO2 07/09/22 1132 100 %     Weight --      Height --      Head Circumference --      Peak Flow --      Pain Score 07/09/22 1134 0     Pain Loc --      Pain Edu? --      Excl. in [place]? --    No data found.  Updated  Vital Signs BP (!) 151/103 (BP Location: Right Arm)   Pulse 73   Temp 98.1 F (36.7 C) (Oral)   Resp 18   LMP 07/01/2022 (Exact Date)   SpO2 100%   Visual Acuity Right Eye Distance:   Left Eye Distance:   Bilateral Distance:    Right Eye Near:   Left Eye Near:    Bilateral Near:     Physical Exam Vitals and nursing note reviewed.  Constitutional:      Appearance: Normal appearance. She is well-developed.  HENT:     Head: Normocephalic.     Right Ear: Tympanic membrane, ear canal and external ear normal.     Left Ear: Ear canal and external ear normal.     Nose: Congestion present. No rhinorrhea.     Right Turbinates: Enlarged and swollen.     Left Turbinates: Enlarged and swollen.     Right Sinus: No maxillary sinus tenderness or frontal sinus tenderness.     Left Sinus: No maxillary sinus tenderness or frontal sinus tenderness.     Mouth/Throat:     Lips: Pink.     Mouth: Mucous membranes are moist.      Pharynx: Uvula midline. Posterior oropharyngeal erythema present. No pharyngeal swelling.     Comments: Cobblestoning present on posterior oropharynx Eyes:     Extraocular Movements: Extraocular movements intact.     Conjunctiva/sclera: Conjunctivae normal.     Pupils: Pupils are equal, round, and reactive to light.  Neck:     Thyroid: No thyromegaly.     Trachea: No tracheal deviation.  Cardiovascular:     Rate and Rhythm: Normal rate and regular rhythm.     Pulses: Normal pulses.     Heart sounds: Normal heart sounds.  Pulmonary:     Effort: Pulmonary effort is normal.     Breath sounds: Normal breath sounds.  Abdominal:     General: Bowel sounds are normal. There is no distension.     Palpations: Abdomen is soft.     Tenderness: There is no abdominal tenderness.  Musculoskeletal:     Cervical back: Normal range of motion and neck supple.  Skin:    General: Skin is warm and dry.  Neurological:     General: No focal deficit present.     Mental Status: She is alert and oriented to person, place, and time.  Psychiatric:        Mood and Affect: Mood normal.        Behavior: Behavior normal.        Thought Content: Thought content normal.        Judgment: Judgment normal.      UC Treatments / Results  Labs (all labs ordered are listed, but only abnormal results are displayed) Labs Reviewed - No data to display  EKG   Radiology No results found.  Procedures Procedures (including critical care time)  Medications Ordered in UC Medications - No data to display  Initial Impression / Assessment and Plan / UC Course  I have reviewed the triage vital signs and the nursing notes.  Pertinent labs & imaging results that were available during my care of the patient were reviewed by me and considered in my medical decision making (see chart for details).  The patient is well-appearing, she is in no acute distress, vital signs are stable.  Symptoms appear to be  consistent with allergic rhinitis.  Will start patient on Bromfed-DM to work as an antihistamine, and fluticasone 50  mcg nasal spray.  Patient was advised to continue cetirizine 10 mg as an antihistamine.  Patient was advised that if symptoms do not improve within the next 5 days, Augmentin 875/125 mg has been provided based on the duration of her symptoms and with any worsening.  Patient was given supportive care recommendations along with follow-up precautions.  Patient is in agreement with this plan of care and verbalized understanding.  All questions were answered.  Patient stable for discharge.  Final Clinical Impressions(s) / UC Diagnoses   Final diagnoses:  Allergic rhinitis, unspecified seasonality, unspecified trigger     Discharge Instructions      Take medication as directed. Continue your current allergy medication regimen. Increase fluids and get plenty of rest. May take over-the-counter Tylenol as needed for pain, fever, or general discomfort. Recommend normal saline nasal spray to help with nasal congestion throughout the day. For your cough, it may be helpful to use a humidifier at bedtime during sleep. If your symptoms do not improve within the next 3 to 5 days, a prescription for Augmentin has been provided for you to start. Follow-up as needed.      ED Prescriptions     Medication Sig Dispense Auth. Provider   brompheniramine-pseudoephedrine-DM 30-2-10 MG/5ML syrup Take 5 mLs by mouth 4 (four) times daily as needed. 140 mL Sendy Pluta-Warren, Alda Lea, NP   fluticasone (FLONASE) 50 MCG/ACT nasal spray Place 2 sprays into both nostrils daily. 16 g Chrystel Barefield-Warren, Alda Lea, NP   amoxicillin-clavulanate (AUGMENTIN) 875-125 MG tablet Take 1 tablet by mouth every 12 (twelve) hours. 14 tablet Jetta Murray-Warren, Alda Lea, NP      PDMP not reviewed this encounter.   Tish Men, NP 07/09/22 1220

## 2022-07-09 NOTE — Discharge Instructions (Addendum)
Take medication as directed. Continue your current allergy medication regimen. Increase fluids and get plenty of rest. May take over-the-counter Tylenol as needed for pain, fever, or general discomfort. Recommend normal saline nasal spray to help with nasal congestion throughout the day. For your cough, it may be helpful to use a humidifier at bedtime during sleep. If your symptoms do not improve within the next 3 to 5 days, a prescription for Augmentin has been provided for you to start. Follow-up as needed.

## 2022-07-09 NOTE — ED Triage Notes (Signed)
Symptoms x 2 weeks.  Sneezing, nasal congestion, itchy eyes, sore throat.  Has been taking severe sinus and robitussin medication to help with symptoms with little relief

## 2022-10-29 IMAGING — DX DG KNEE COMPLETE 4+V*R*
4 series · 4 of 4 positions shown · non-contrast
Comparison: Right knee series 06/28/2017.

CLINICAL DATA: 50-year-old female with knee pain and swelling.

EXAM:
RIGHT KNEE - COMPLETE 4+ VIEW

[knee ap]
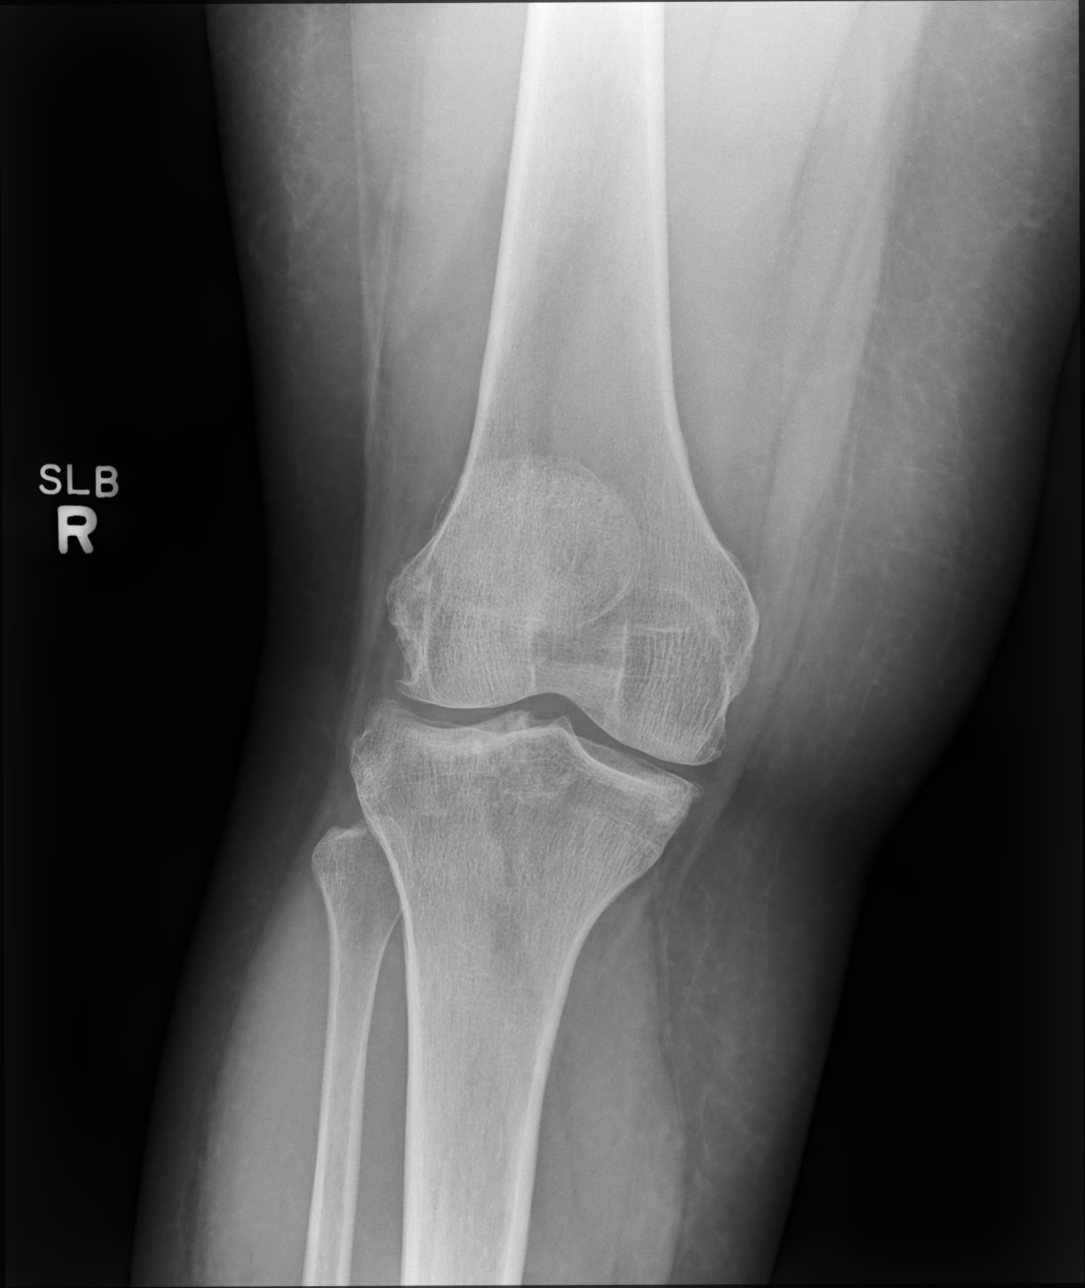

[knee mlo (1 of 2)]
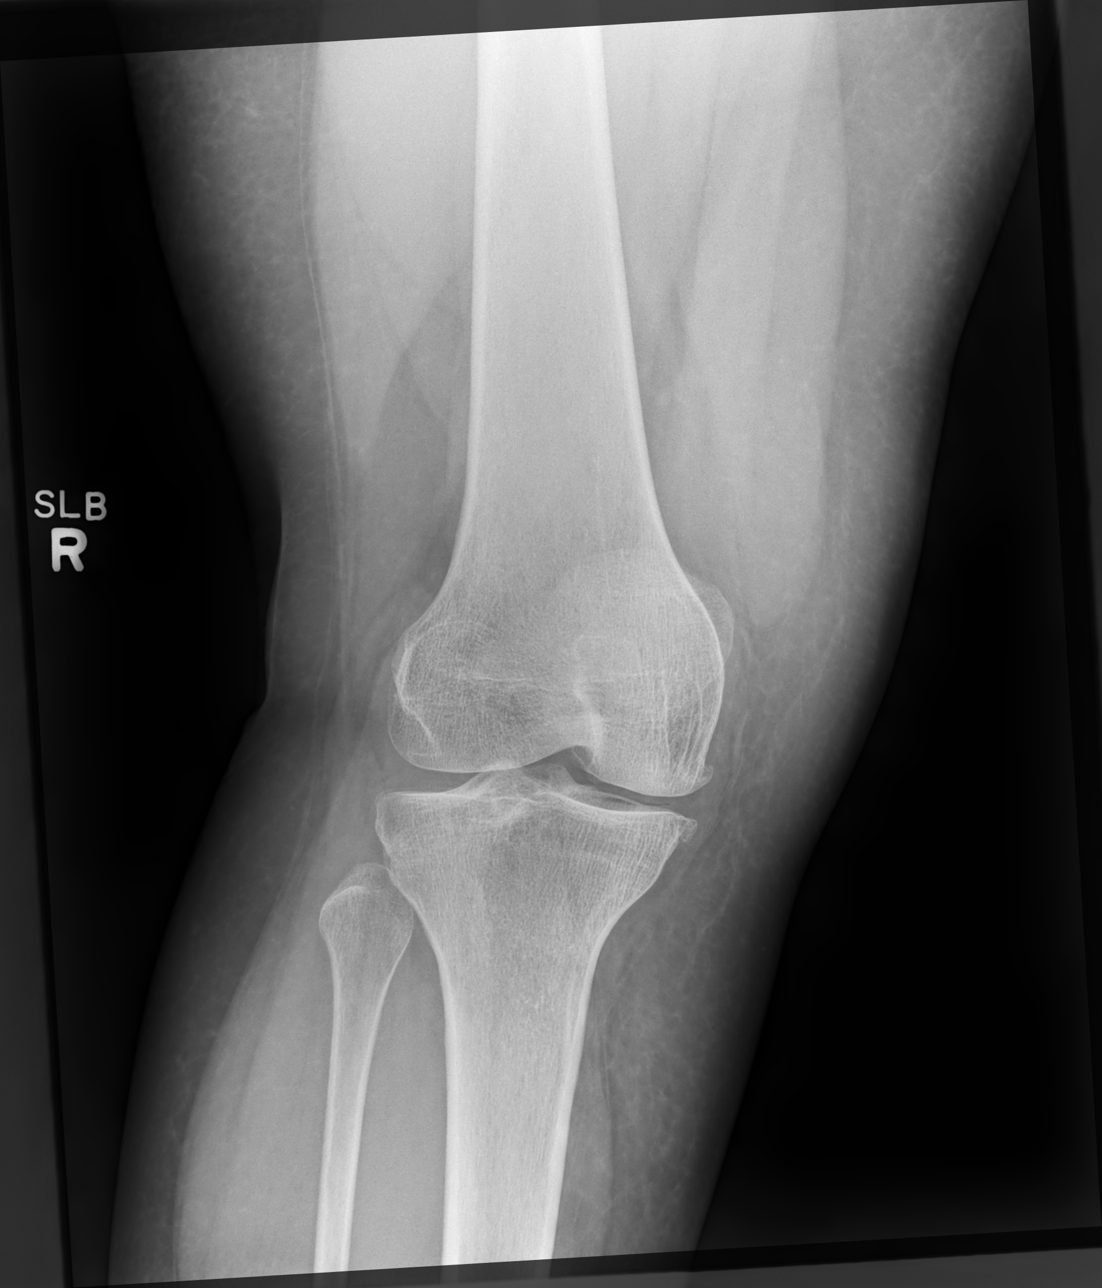

[knee mlo (2 of 2)]
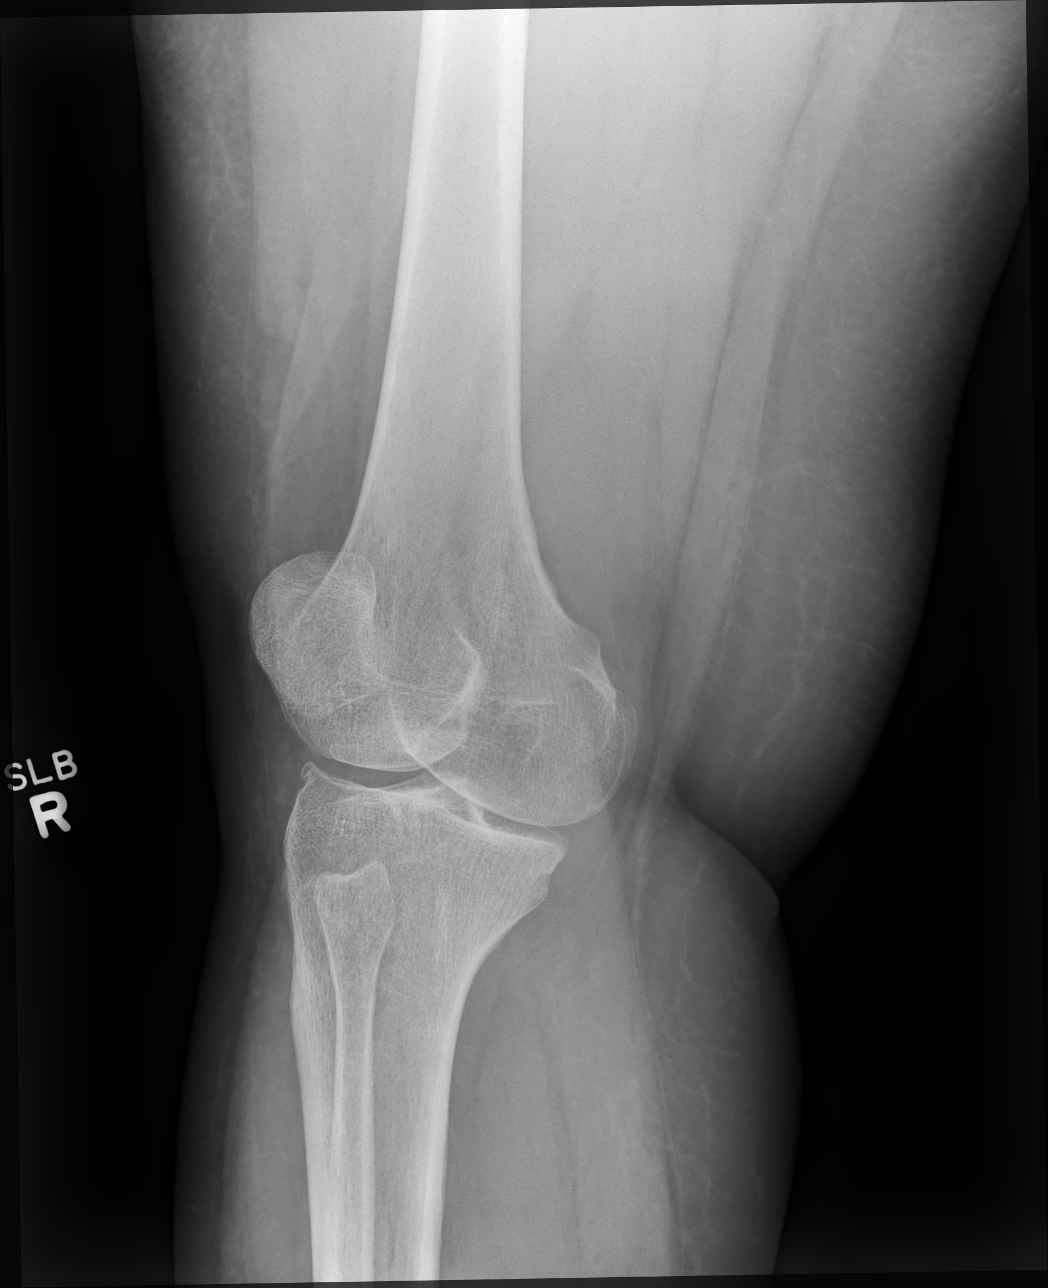

[knee lat]
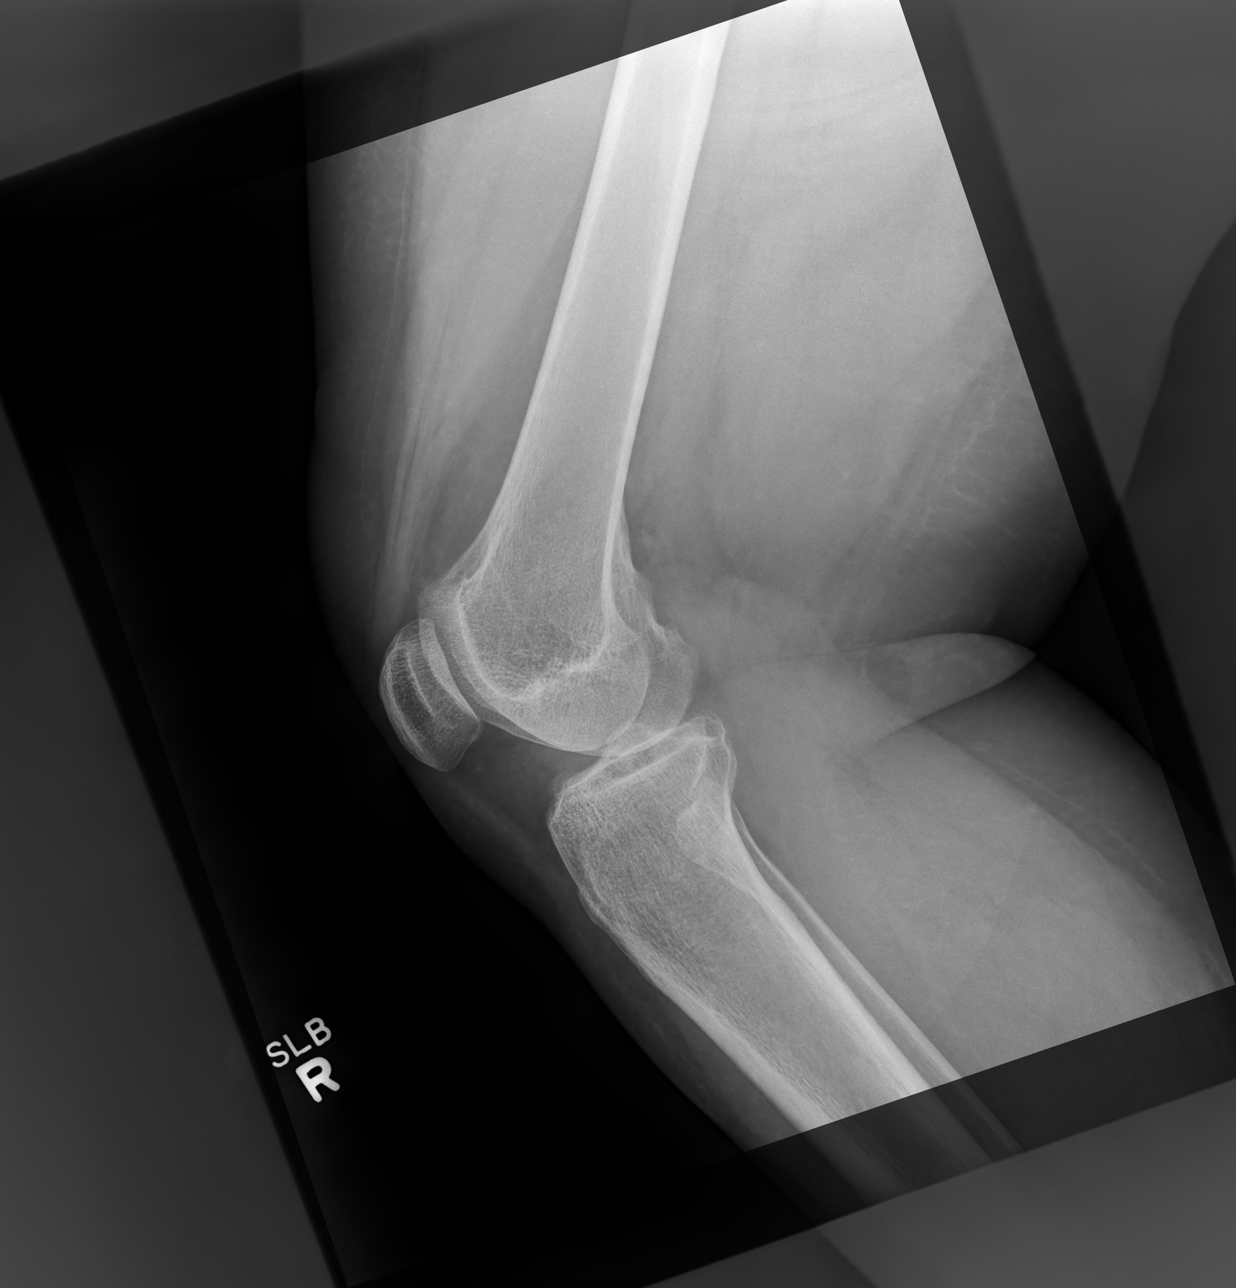

[4 of 4 positions shown; findings below may reference images not displayed]

FINDINGS: Bone mineralization is within normal limits. No evidence of joint
effusion. Tricompartmental degenerative spurring has increased since
8515, but joint spaces are relatively preserved. No acute osseous
abnormality identified. Soft tissue contours appear within normal
limits.
IMPRESSION: Increased tricompartmental degenerative spurring since 8515 with no
acute osseous abnormality identified about the right knee.

## 2022-11-14 ENCOUNTER — Other Ambulatory Visit: Payer: Self-pay | Admitting: Family Medicine

## 2022-11-14 DIAGNOSIS — Z1231 Encounter for screening mammogram for malignant neoplasm of breast: Secondary | ICD-10-CM

## 2022-11-15 ENCOUNTER — Encounter: Payer: Self-pay | Admitting: *Deleted

## 2022-11-28 ENCOUNTER — Telehealth: Payer: Self-pay | Admitting: Internal Medicine

## 2022-11-28 NOTE — Telephone Encounter (Signed)
Received questionnaire and it is in review.

## 2022-11-29 ENCOUNTER — Telehealth: Payer: Self-pay | Admitting: *Deleted

## 2022-11-29 NOTE — Telephone Encounter (Signed)
  Procedure: Colonoscopy  Height: 5'4 Weight: 242lbs       Have you had a colonoscopy before?  no  Do you have family history of colon cancer?  no  Do you have a family history of polyps? no  Previous colonoscopy with polyps removed? no  Do you have a history colorectal cancer?   no  Are you diabetic?  no  Do you have a prosthetic or mechanical heart valve? no  Do you have a pacemaker/defibrillator?   no  Have you had endocarditis/atrial fibrillation?  no  Do you use supplemental oxygen/CPAP?  no  Have you had joint replacement within the last 12 months?  no  Do you tend to be constipated or have to use laxatives?  no   Do you have history of alcohol use? If yes, how much and how often.  no  Do you have history or are you using drugs? If yes, what do are you  using?  no  Have you ever had a stroke/heart attack?  no  Have you ever had a heart or other vascular stent placed,?no  Do you take weight loss medication? no  female patients,: have you had a hysterectomy? no                              are you post menopausal?                                do you still have your menstrual cycle? yes    Date of last menstrual period? 11/07/22  Do you take any blood-thinning medications such as: (Plavix, aspirin, Coumadin, Aggrenox, Brilinta, Xarelto, Eliquis, Pradaxa, Savaysa or Effient)? no  If yes we need the name, milligram, dosage and who is prescribing doctor:              None per pt   No Known Allergies

## 2022-12-20 ENCOUNTER — Ambulatory Visit
Admission: RE | Admit: 2022-12-20 | Discharge: 2022-12-20 | Disposition: A | Discharge status: Home / Self Care | Payer: BC Managed Care – PPO | Source: Ambulatory Visit | Attending: Family Medicine | Admitting: Family Medicine

## 2022-12-20 DIAGNOSIS — Z1231 Encounter for screening mammogram for malignant neoplasm of breast: Secondary | ICD-10-CM | POA: Insufficient documentation

## 2023-01-16 NOTE — Telephone Encounter (Signed)
Will call once we get future schedule to get schedule

## 2023-01-24 ENCOUNTER — Encounter: Payer: Self-pay | Admitting: *Deleted

## 2023-01-24 ENCOUNTER — Telehealth: Payer: Self-pay | Admitting: *Deleted

## 2023-01-24 ENCOUNTER — Other Ambulatory Visit: Payer: Self-pay | Admitting: *Deleted

## 2023-01-24 MED ORDER — PEG 3350-KCL-NA BICARB-NACL 420 G PO SOLR: 4000.0000 mL | Freq: Once | ORAL | 0 refills | Status: AC

## 2023-01-24 NOTE — Telephone Encounter (Signed)
Pt has been scheduled for 03/05/23 with Dr.Carver. Instructions mailed and prep sent to the pharmacy.

## 2023-01-24 NOTE — Telephone Encounter (Signed)
Referral completed

## 2023-01-24 NOTE — Telephone Encounter (Signed)
error 

## 2023-03-01 ENCOUNTER — Other Ambulatory Visit (HOSPITAL_COMMUNITY): Payer: BC Managed Care – PPO

## 2023-03-01 NOTE — Patient Instructions (Signed)
Katie Padilla  03/01/2023     @PREFPERIOPPHARMACY @   Your procedure is scheduled on  03/05/2023.   Report to [facility] at  0745  A.M.    Call this number if you have problems the morning of surgery:  [phone]  If you experience any cold or flu symptoms such as cough, fever, chills, shortness of breath, etc. between now and your scheduled surgery, please notify us at the above number.   Remember:  Follow the diet and prep instructions given to you by the office.   You may drink clear liquids until 0545 am on 03/05/2023.    Clear liquids allowed are:                    Water, Carbonated beverages (diabetics please choose diet or no sugar options), Black Coffee Only (No creamer, milk or cream, including half & half and powdered creamer), and Clear Sports drink (No red color; diabetics please choose diet or no sugar options)    Take these medicines the morning of surgery with A SIP OF WATER                                               None.    Do not wear jewelry, make-up or nail polish, including gel polish,  artificial nails, or any other type of covering on natural nails (fingers and  toes).  Do not wear lotions, powders, or perfumes, or deodorant.  Do not shave 48 hours prior to surgery.  Men may shave face and neck.  Do not bring valuables to the hospital.  [facility] is not responsible for any belongings or valuables.  Contacts, dentures or bridgework may not be worn into surgery.  Leave your suitcase in the car.  After surgery it may be brought to your room.  For patients admitted to the hospital, discharge time will be determined by your treatment team.  Patients discharged the day of surgery will not be allowed to drive home and must have someone with them for 24 hours.    Special instructions:   DO NOT smoke tobacco or vape for 24 hours before your procedure.  Please read over the following fact sheets that you were given. Anesthesia  Post-op Instructions and Care and Recovery After Surgery      Colonoscopy, Adult, Care After The following information offers guidance on how to care for yourself after your procedure. Your health care provider may also give you more specific instructions. If you have problems or questions, contact your health care provider. What can I expect after the procedure? After the procedure, it is common to have: A small amount of blood in your stool for 24 hours after the procedure. Some gas. Mild cramping or bloating of your abdomen. Follow these instructions at home: Eating and drinking  Drink enough fluid to keep your urine pale yellow. Follow instructions from your health care provider about eating or drinking restrictions. Resume your normal diet as told by your health care provider. Avoid heavy or fried foods that are hard to digest. Activity Rest as told by your health care provider. Avoid sitting for a long time without moving. Get up to take short walks every 1-2 hours. This is important to improve blood flow and breathing. Ask for help if you feel weak or  unsteady. Return to your normal activities as told by your health care provider. Ask your health care provider what activities are safe for you. Managing cramping and bloating  Try walking around when you have cramps or feel bloated. If directed, apply heat to your abdomen as told by your health care provider. Use the heat source that your health care provider recommends, such as a moist heat pack or a heating pad. Place a towel between your skin and the heat source. Leave the heat on for 20-30 minutes. Remove the heat if your skin turns bright red. This is especially important if you are unable to feel pain, heat, or cold. You have a greater risk of getting burned. General instructions If you were given a sedative during the procedure, it can affect you for several hours. Do not drive or operate machinery until your health care  provider says that it is safe. For the first 24 hours after the procedure: Do not sign important documents. Do not drink alcohol. Do your regular daily activities at a slower pace than normal. Eat soft foods that are easy to digest. Take over-the-counter and prescription medicines only as told by your health care provider. Keep all follow-up visits. This is important. Contact a health care provider if: You have blood in your stool 2-3 days after the procedure. Get help right away if: You have more than a small spotting of blood in your stool. You have large blood clots in your stool. You have swelling of your abdomen. You have nausea or vomiting. You have a fever. You have increasing pain in your abdomen that is not relieved with medicine. These symptoms may be an emergency. Get help right away. Call 911. Do not wait to see if the symptoms will go away. Do not drive yourself to the hospital. Summary After the procedure, it is common to have a small amount of blood in your stool. You may also have mild cramping and bloating of your abdomen. If you were given a sedative during the procedure, it can affect you for several hours. Do not drive or operate machinery until your health care provider says that it is safe. Get help right away if you have a lot of blood in your stool, nausea or vomiting, a fever, or increased pain in your abdomen. This information is not intended to replace advice given to you by your health care provider. Make sure you discuss any questions you have with your health care provider. Document Revised: 06/06/2022 Document Reviewed: 12/15/2020 Elsevier Patient Education  2024 [employer] Monitored Anesthesia Care, Care After The following information offers guidance on how to care for yourself after your procedure. Your health care provider may also give you more specific instructions. If you have problems or questions, contact your health care provider. What can I  expect after the procedure? After the procedure, it is common to have: Tiredness. Little or no memory about what happened during or after the procedure. Impaired judgment when it comes to making decisions. Nausea or vomiting. Some trouble with balance. Follow these instructions at home: For the time period you were told by your health care provider:  Rest. Do not participate in activities where you could fall or become injured. Do not drive or use machinery. Do not drink alcohol. Do not take sleeping pills or medicines that cause drowsiness. Do not make important decisions or sign legal documents. Do not take care of children on your own. Medicines Take over-the-counter and prescription medicines only  as told by your health care provider. If you were prescribed antibiotics, take them as told by your health care provider. Do not stop using the antibiotic even if you start to feel better. Eating and drinking Follow instructions from your health care provider about what you may eat and drink. Drink enough fluid to keep your urine pale yellow. If you vomit: Drink clear fluids slowly and in small amounts as you are able. Clear fluids include water, ice chips, low-calorie sports drinks, and fruit juice that has water added to it (diluted fruit juice). Eat light and bland foods in small amounts as you are able. These foods include bananas, applesauce, rice, lean meats, toast, and crackers. General instructions  Have a responsible adult stay with you for the time you are told. It is important to have someone help care for you until you are awake and alert. If you have sleep apnea, surgery and some medicines can increase your risk for breathing problems. Follow instructions from your health care provider about wearing your sleep device: When you are sleeping. This includes during daytime naps. While taking prescription pain medicines, sleeping medicines, or medicines that make you drowsy. Do  not use any products that contain nicotine or tobacco. These products include cigarettes, chewing tobacco, and vaping devices, such as e-cigarettes. If you need help quitting, ask your health care provider. Contact a health care provider if: You feel nauseous or vomit every time you eat or drink. You feel light-headed. You are still sleepy or having trouble with balance after 24 hours. You get a rash. You have a fever. You have redness or swelling around the IV site. Get help right away if: You have trouble breathing. You have new confusion after you get home. These symptoms may be an emergency. Get help right away. Call 911. Do not wait to see if the symptoms will go away. Do not drive yourself to the hospital. This information is not intended to replace advice given to you by your health care provider. Make sure you discuss any questions you have with your health care provider. Document Revised: 09/19/2021 Document Reviewed: 09/19/2021 Elsevier Patient Education  2024 [employer].

## 2023-03-02 ENCOUNTER — Encounter (HOSPITAL_COMMUNITY): Payer: Self-pay

## 2023-03-02 ENCOUNTER — Encounter (HOSPITAL_COMMUNITY)
Admission: RE | Admit: 2023-03-02 | Discharge: 2023-03-02 | Disposition: A | Discharge status: Home / Self Care | Payer: BC Managed Care – PPO | Source: Ambulatory Visit | Attending: Internal Medicine | Admitting: Internal Medicine

## 2023-03-02 ENCOUNTER — Other Ambulatory Visit (HOSPITAL_COMMUNITY): Payer: BC Managed Care – PPO

## 2023-03-02 VITALS — BP 142/83 | HR 74 | Temp 97.9°F | Resp 18 | Ht 64.0 in | Wt 225.1 lb

## 2023-03-02 DIAGNOSIS — R638 Other symptoms and signs concerning food and fluid intake: Secondary | ICD-10-CM | POA: Insufficient documentation

## 2023-03-02 DIAGNOSIS — I498 Other specified cardiac arrhythmias: Secondary | ICD-10-CM | POA: Insufficient documentation

## 2023-03-02 DIAGNOSIS — K635 Polyp of colon: Secondary | ICD-10-CM | POA: Diagnosis not present

## 2023-03-02 DIAGNOSIS — K648 Other hemorrhoids: Secondary | ICD-10-CM | POA: Diagnosis not present

## 2023-03-02 DIAGNOSIS — Z87891 Personal history of nicotine dependence: Secondary | ICD-10-CM | POA: Diagnosis not present

## 2023-03-02 DIAGNOSIS — Z6838 Body mass index (BMI) 38.0-38.9, adult: Secondary | ICD-10-CM | POA: Diagnosis not present

## 2023-03-02 DIAGNOSIS — Z01818 Encounter for other preprocedural examination: Secondary | ICD-10-CM | POA: Insufficient documentation

## 2023-03-02 DIAGNOSIS — Z1211 Encounter for screening for malignant neoplasm of colon: Secondary | ICD-10-CM | POA: Diagnosis present

## 2023-03-02 LAB — POCT PREGNANCY, URINE: Preg Test, Ur: NEGATIVE

## 2023-03-05 ENCOUNTER — Ambulatory Visit (HOSPITAL_COMMUNITY): Payer: BC Managed Care – PPO | Admitting: Certified Registered"

## 2023-03-05 ENCOUNTER — Encounter (HOSPITAL_COMMUNITY)
Admission: RE | Disposition: A | Discharge status: Home / Self Care | Payer: Self-pay | Source: Home / Self Care | Attending: Internal Medicine

## 2023-03-05 ENCOUNTER — Encounter (HOSPITAL_COMMUNITY): Payer: Self-pay

## 2023-03-05 ENCOUNTER — Ambulatory Visit (HOSPITAL_COMMUNITY)
Admission: RE | Admit: 2023-03-05 | Discharge: 2023-03-05 | Disposition: A | Discharge status: Home / Self Care | Payer: BC Managed Care – PPO | Attending: Internal Medicine | Admitting: Internal Medicine

## 2023-03-05 DIAGNOSIS — Z1211 Encounter for screening for malignant neoplasm of colon: Secondary | ICD-10-CM | POA: Insufficient documentation

## 2023-03-05 DIAGNOSIS — K635 Polyp of colon: Secondary | ICD-10-CM | POA: Insufficient documentation

## 2023-03-05 DIAGNOSIS — Z6838 Body mass index (BMI) 38.0-38.9, adult: Secondary | ICD-10-CM | POA: Insufficient documentation

## 2023-03-05 DIAGNOSIS — K648 Other hemorrhoids: Secondary | ICD-10-CM | POA: Diagnosis not present

## 2023-03-05 DIAGNOSIS — Z87891 Personal history of nicotine dependence: Secondary | ICD-10-CM | POA: Insufficient documentation

## 2023-03-05 HISTORY — PX: COLONOSCOPY WITH PROPOFOL: SHX5780

## 2023-03-05 HISTORY — PX: POLYPECTOMY: SHX5525

## 2023-03-05 SURGERY — COLONOSCOPY WITH PROPOFOL
Anesthesia: General

## 2023-03-05 MED ORDER — PROPOFOL 10 MG/ML IV BOLUS
INTRAVENOUS | Status: DC | PRN
  Administered 2023-03-05 (×2): 50 mg via INTRAVENOUS
  Administered 2023-03-05: 120 mg via INTRAVENOUS

## 2023-03-05 MED ORDER — LACTATED RINGERS IV SOLN: INTRAVENOUS | Status: DC | PRN

## 2023-03-05 MED ORDER — LIDOCAINE HCL (CARDIAC) PF 100 MG/5ML IV SOSY
PREFILLED_SYRINGE | INTRAVENOUS | Status: DC | PRN
  Administered 2023-03-05: 50 mg via INTRAVENOUS

## 2023-03-05 NOTE — Transfer of Care (Signed)
Immediate Anesthesia Transfer of Care Note  Patient: Katie Padilla  Procedure(s) Performed: COLONOSCOPY WITH PROPOFOL POLYPECTOMY  Patient Location: Short Stay  Anesthesia Type:General  Level of Consciousness: awake, alert , and oriented  Airway & Oxygen Therapy: Patient Spontanous Breathing  Post-op Assessment: Report given to RN and Post -op Vital signs reviewed and stable  Post vital signs: Reviewed and stable  Last Vitals:  Vitals Value Taken Time  BP    Temp    Pulse    Resp    SpO2      Last Pain:  Vitals:   03/05/23 0925  PainSc: 0-No pain         Complications: No notable events documented.

## 2023-03-05 NOTE — Op Note (Signed)
[facility] Patient Name: Katie Padilla Procedure Date: 03/05/2023 9:17 AM MRN: [id number] Date of Birth: 08-02-1970 Attending MD: Hennie Duos. Marletta Lor , [place], [id number] CSN: [id number] Age: 52 Admit Type: Outpatient Procedure:                Colonoscopy Indications:              Screening for colorectal malignant neoplasm Providers:                Hennie Duos. Marletta Lor, DO, Crystal Page, Francoise Ceo                            RN, RN, Barkley Bruns L. Jessee Avers, Technician Referring MD:              Medicines:                See the Anesthesia note for documentation of the                            administered medications Complications:            No immediate complications. Estimated Blood Loss:     Estimated blood loss was minimal. Procedure:                Pre-Anesthesia Assessment:                           - The anesthesia plan was to use monitored                            anesthesia care (MAC).                           After obtaining informed consent, the colonoscope                            was passed under direct vision. Throughout the                            procedure, the patient's blood pressure, pulse, and                            oxygen saturations were monitored continuously. The                            [id number]-HQ190L ([id number]) scope was introduced through                            the anus and advanced to the the terminal ileum,                            with identification of the appendiceal orifice and                            IC valve. The colonoscopy was performed without  difficulty. The patient tolerated the procedure                            well. The quality of the bowel preparation was                            evaluated using the BBPS [facility] Bowel Preparation                            Scale) with scores of: Right Colon = 3, Transverse                            Colon = 3 and Left Colon = 3 (entire mucosa seen                             well with no residual staining, small fragments of                            stool or opaque liquid). The total BBPS score                            equals 9. Scope In: 9:29:15 AM Scope Out: 9:41:51 AM Scope Withdrawal Time: 0 hours 11 minutes 6 seconds  Total Procedure Duration: 0 hours 12 minutes 36 seconds  Findings:      Non-bleeding internal hemorrhoids were found during endoscopy.      A 4 mm polyp was found in the sigmoid colon. The polyp was sessile. The       polyp was removed with a cold snare. Resection and retrieval were       complete.      The terminal ileum appeared normal.      The exam was otherwise without abnormality. Impression:               - Non-bleeding internal hemorrhoids.                           - One 4 mm polyp in the sigmoid colon, removed with                            a cold snare. Resected and retrieved.                           - The examined portion of the ileum was normal.                           - The examination was otherwise normal. Moderate Sedation:      Per Anesthesia Care Recommendation:           - Patient has a contact number available for                            emergencies. The signs and symptoms of potential  delayed complications were discussed with the                            patient. Return to normal activities tomorrow.                            Written discharge instructions were provided to the                            patient.                           - Resume previous diet.                           - Continue present medications.                           - Await pathology results.                           - Repeat colonoscopy in 7-10 years for surveillance.                           - Return to GI clinic PRN. Procedure Code(s):        --- Professional ---                           [phone], Colonoscopy, flexible; with removal of                             tumor(s), polyp(s), or other lesion(s) by snare                            technique Diagnosis Code(s):        --- Professional ---                           Z12.11, Encounter for screening for malignant                            neoplasm of colon                           D12.5, Benign neoplasm of sigmoid colon                           K64.8, Other hemorrhoids CPT copyright 2022 American Medical Association. All rights reserved. The codes documented in this report are preliminary and upon coder review may  be revised to meet current compliance requirements. Hennie Duos. Marletta Lor, DO Hennie Duos. Marletta Lor, DO 03/05/2023 9:45:14 AM This report has been signed electronically. Number of Addenda: 0

## 2023-03-05 NOTE — Anesthesia Procedure Notes (Signed)
Date/Time: 03/05/2023 9:25 AM  Performed by: Julian Reil, CRNAPre-anesthesia Checklist: Patient identified, Emergency Drugs available, Suction available and Patient being monitored Patient Re-evaluated:Patient Re-evaluated prior to induction Oxygen Delivery Method: Nasal cannula Induction Type: IV induction Placement Confirmation: positive ETCO2 Comments: Optiflow High Flow St.  O2 used.

## 2023-03-05 NOTE — Anesthesia Preprocedure Evaluation (Signed)
Anesthesia Evaluation  Patient identified by MRN, date of birth, ID band Patient awake    Reviewed: Allergy & Precautions, H&P , NPO status , Patient's Chart, lab work & pertinent test results, reviewed documented beta blocker date and time   Airway Mallampati: II  TM Distance: >3 FB Neck ROM: full    Dental no notable dental hx.    Pulmonary neg pulmonary ROS, former smoker   Pulmonary exam normal breath sounds clear to auscultation       Cardiovascular Exercise Tolerance: Good negative cardio ROS  Rhythm:regular Rate:Normal     Neuro/Psych negative neurological ROS  negative psych ROS   GI/Hepatic negative GI ROS, Neg liver ROS,,,  Endo/Other    Morbid obesity  Renal/GU negative Renal ROS  negative genitourinary   Musculoskeletal   Abdominal   Peds  Hematology negative hematology ROS (+)   Anesthesia Other Findings   Reproductive/Obstetrics negative OB ROS                             Anesthesia Physical Anesthesia Plan  ASA: 2  Anesthesia Plan: General   Post-op Pain Management:    Induction:   PONV Risk Score and Plan: Propofol infusion  Airway Management Planned:   Additional Equipment:   Intra-op Plan:   Post-operative Plan:   Informed Consent: I have reviewed the patients History and Physical, chart, labs and discussed the procedure including the risks, benefits and alternatives for the proposed anesthesia with the patient or authorized representative who has indicated his/her understanding and acceptance.     Dental Advisory Given  Plan Discussed with: CRNA  Anesthesia Plan Comments:        Anesthesia Quick Evaluation

## 2023-03-05 NOTE — Anesthesia Postprocedure Evaluation (Signed)
Anesthesia Post Note  Patient: Katie Padilla  Procedure(s) Performed: COLONOSCOPY WITH PROPOFOL POLYPECTOMY  Patient location during evaluation: Phase II Anesthesia Type: General Level of consciousness: awake Pain management: pain level controlled Vital Signs Assessment: post-procedure vital signs reviewed and stable Respiratory status: spontaneous breathing and respiratory function stable Cardiovascular status: blood pressure returned to baseline and stable Postop Assessment: no headache and no apparent nausea or vomiting Anesthetic complications: no Comments: Late entry   No notable events documented.   Last Vitals:  Vitals:   03/05/23 0945 03/05/23 0951  BP: 95/79 116/65  Pulse: (!) 55 60  Resp: 16 18  Temp: 36.7 C   SpO2: 100% 100%    Last Pain:  Vitals:   03/05/23 0951  TempSrc:   PainSc: 0-No pain                 Windell Norfolk

## 2023-03-05 NOTE — H&P (Signed)
Primary Care Physician:  Hillery Aldo, MD Primary Gastroenterologist:  Dr. Marletta Lor  Pre-Procedure History & Physical: HPI:  Katie Padilla is a 52 y.o. female is here for first ever colonoscopy for colon cancer screening purposes.    History reviewed. No pertinent past medical history.  Past Surgical History:  Procedure Laterality Date   TUBAL LIGATION      Prior to Admission medications   Medication Sig Start Date End Date Taking? Authorizing Provider  ferrous sulfate 324 MG TBEC Take 324 mg by mouth 1 day or 1 dose.    [provider]    Allergies as of 01/23/2023   (No Known Allergies)    Family History  Problem Relation Age of Onset   Heart failure Mother    Hypertension Mother    Diabetes Mother    Healthy Father    Breast cancer Neg Hx     Social History   Socioeconomic History   Marital status: Single    Spouse name: Not on file   Number of children: Not on file   Years of education: Not on file   Highest education level: Not on file  Occupational History   Not on file  Tobacco Use   Smoking status: Former    Current packs/day: 0.50    Average packs/day: 0.5 packs/day for 5.0 years (2.5 ttl pk-yrs)    Types: Cigarettes   Smokeless tobacco: Never  Substance and Sexual Activity   Alcohol use: Not on file    Comment: occ   Drug use: Not on file   Sexual activity: Not on file  Other Topics Concern   Not on file  Social History Narrative   Not on file   Social Determinants of Health   Financial Resource Strain: Not on file  Food Insecurity: Not on file  Transportation Needs: Not on file  Physical Activity: Not on file  Stress: Not on file  Social Connections: Not on file  Intimate Partner Violence: Not At Risk (01/24/2023)   Received from [facility]   Humiliation, Afraid, Rape, and Kick questionnaire    Fear of Current or Ex-Partner: No    Emotionally Abused: No    Physically Abused: No    Sexually Abused: No    Review of  Systems: See HPI, otherwise negative ROS  Physical Exam: Vital signs in last 24 hours: Temp:  [97.8 F (36.6 C)] 97.8 F (36.6 C) (10/28 0835) Pulse Rate:  [62] 62 (10/28 0835) Resp:  [20] 20 (10/28 0835) BP: (141)/(80) 141/80 (10/28 0835) SpO2:  [99 %] 99 % (10/28 0835) Weight:  [102.1 kg] 102.1 kg (10/28 0834)   General:   Alert,  Well-developed, well-nourished, pleasant and cooperative in NAD Head:  Normocephalic and atraumatic. Eyes:  Sclera clear, no icterus.   Conjunctiva pink. Ears:  Normal auditory acuity. Nose:  No deformity, discharge,  or lesions. Msk:  Symmetrical without gross deformities. Normal posture. Extremities:  Without clubbing or edema. Neurologic:  Alert and  oriented x4;  grossly normal neurologically. Skin:  Intact without significant lesions or rashes. Psych:  Alert and cooperative. Normal mood and affect.  Impression/Plan: Katie Padilla is here for a colonoscopy to be performed for colon cancer screening purposes.  The risks of the procedure including infection, bleed, or perforation as well as benefits, limitations, alternatives and imponderables have been reviewed with the patient. Questions have been answered. All parties agreeable.

## 2023-03-05 NOTE — Discharge Instructions (Signed)
  Colonoscopy Discharge Instructions  Read the instructions outlined below and refer to this sheet in the next few weeks. These discharge instructions provide you with general information on caring for yourself after you leave the hospital. Your doctor may also give you specific instructions. While your treatment has been planned according to the most current medical practices available, unavoidable complications occasionally occur.   ACTIVITY You may resume your regular activity, but move at a slower pace for the next 24 hours.  Take frequent rest periods for the next 24 hours.  Walking will help get rid of the air and reduce the bloated feeling in your belly (abdomen).  No driving for 24 hours (because of the medicine (anesthesia) used during the test).   Do not sign any important legal documents or operate any machinery for 24 hours (because of the anesthesia used during the test).  NUTRITION Drink plenty of fluids.  You may resume your normal diet as instructed by your doctor.  Begin with a light meal and progress to your normal diet. Heavy or fried foods are harder to digest and may make you feel sick to your stomach (nauseated).  Avoid alcoholic beverages for 24 hours or as instructed.  MEDICATIONS You may resume your normal medications unless your doctor tells you otherwise.  WHAT YOU CAN EXPECT TODAY Some feelings of bloating in the abdomen.  Passage of more gas than usual.  Spotting of blood in your stool or on the toilet paper.  IF YOU HAD POLYPS REMOVED DURING THE COLONOSCOPY: No aspirin products for 7 days or as instructed.  No alcohol for 7 days or as instructed.  Eat a soft diet for the next 24 hours.  FINDING OUT THE RESULTS OF YOUR TEST Not all test results are available during your visit. If your test results are not back during the visit, make an appointment with your caregiver to find out the results. Do not assume everything is normal if you have not heard from your  caregiver or the medical facility. It is important for you to follow up on all of your test results.  SEEK IMMEDIATE MEDICAL ATTENTION IF: You have more than a spotting of blood in your stool.  Your belly is swollen (abdominal distention).  You are nauseated or vomiting.  You have a temperature over 101.  You have abdominal pain or discomfort that is severe or gets worse throughout the day.   Your colonoscopy revealed 1 polyp(s) which I removed successfully. Await pathology results, my office will contact you. I recommend repeating colonoscopy in 7-10 years for surveillance purposes depending on pathology results. Otherwise follow up as needed.    I hope you have a great rest of your week!  Hennie Duos. Marletta Lor, D.O. Gastroenterology and Hepatology [facility]

## 2023-03-06 LAB — SURGICAL PATHOLOGY

## 2023-03-12 ENCOUNTER — Encounter (HOSPITAL_COMMUNITY): Payer: Self-pay | Admitting: Internal Medicine

## 2023-10-19 NOTE — H&P (Signed)
 Katie Padilla is a 53 y.o. scheduled for a TAH/ BSO Pt here for follow up for menorrhagia . Failed several tx for bleeding . Currently on seasonal and still spotting and bleeding . Know fibroid utx. C/o pressure in abdomen  U/s  at [facility] 9/24: FINDINGS:  Uterus   Measurements: 15.2 x 11.8 x 8.8 cm = volume: 824 mL. Multiple  fibroids are noted, the largest measuring 6.6 x 6.0 x 5.6 cm  posteriorly in the fundus.   Endometrium   Thickness: 7 mm.  No focal abnormality visualized.   Right ovary   Not visualized due to overlying bowel gas.   Left ovary   Not visualized due to overlying bowel gas.   Other findings   No abnormal free fluid.    Embx by me - neg   Pap neg    Past Medical History:  has no past medical history on file.  Past Surgical History:  has no past surgical history on file. Family History: family history includes Heart failure in her mother and sister. Social History:  reports that she has quit smoking. Her smoking use included cigarettes. She has never used smokeless tobacco. She reports current alcohol use. She reports that she does not use drugs. OB/GYN History:  OB History       Gravida  3   Para  2   Term      Preterm      AB  1   Living  2        SAB      IAB      Ectopic      Molar      Multiple      Live Births  2             Allergies: has No Known Allergies. Medications:  Current Medications    Current Outpatient Medications:    ferrous sulfate 325 (65 FE) MG tablet, , Disp: , Rfl:    levonorgestrel-ethinyl estradiol (SEASONALE) 0.15 mg-30 mcg (91) tablet, Take 1 tablet by mouth once daily, Disp: 90 tablet, Rfl: 1   famotidine (PEPCID) 20 MG tablet, Take 20 mg by mouth (Patient not taking: Reported on 09/18/2023), Disp: , Rfl:    GAVILYTE-N solution, TAKE 4000 ML BY MOUTH ONCE FOR ONE DOSE (Patient not taking: Reported on 09/18/2023), Disp: , Rfl:    levonorgestrel-ethinyl estradiol (SEASONALE) 0.15 mg-30 mcg (91)  tablet, Take 1 tablet by mouth once daily (Patient not taking: Reported on 09/18/2023), Disp: 84 tablet, Rfl: 3   norethindrone (AYGESTIN) 5 mg tablet, Take 15 mg by mouth (Patient not taking: Reported on 09/18/2023), Disp: , Rfl:    PARoxetine (PAXIL) 10 MG tablet, TAKE 1 TABLET BY MOUTH ONCE DAILY FOR MOOD (Patient not taking: Reported on 09/18/2023), Disp: , Rfl:    PARoxetine (PAXIL) 20 MG tablet, TAKE 1 TABLET BY MOUTH ONCE DAILY FOR MOOD (Patient not taking: Reported on 09/18/2023), Disp: , Rfl:    tranexamic acid (LYSTEDA) 650 mg tablet, Take 1,300 mg by mouth (Patient not taking: Reported on 09/18/2023), Disp: , Rfl:      Review of Systems: General:                      No fatigue or weight loss Eyes:                           No vision changes Ears:  No hearing difficulty Respiratory:                No cough or shortness of breath Pulmonary:                  No asthma or shortness of breath Cardiovascular:           No chest pain, palpitations, dyspnea on exertion Gastrointestinal:          No abdominal bloating, chronic diarrhea, constipations, masses, pain or hematochezia Genitourinary:             No hematuria, dysuria, abnormal vaginal discharge, pelvic pain, +Menometrorrhagia Lymphatic:                   No swollen lymph nodes Musculoskeletal:No muscle weakness Neurologic:                  No extremity weakness, syncope, seizure disorder Psychiatric:                  No history of depression, delusions or suicidal/homicidal ideation      Exam:       Vitals:    06/16//25 1002  BP: (!) 145/89  Pulse: 64      Body mass index is 38.45 kg/m.   WDWN / black female in NAD   Lungs: CTA  CV : RRR without murmur   Neck:  no thyromegaly Abdomen: soft , no mass, normal active bowel sounds,  non-tender, no rebound tenderness16 weeks palpated  Pelvic: tanner stage 5 ,  External genitalia: vulva /labia no lesions Urethra: no prolapse Vagina: normal  physiologic d/c Cervix: no lesions, no cervical motion tenderness   Uterus: normal size shape and contour, non-tender16 weeks irregular deviated to left  Adnexa: no mass,  non-tender   Rectovaginal: no mass heme negative   Impression:    The primary encounter diagnosis was Menorrhagia with regular cycle. A diagnosis of Intramural leiomyoma of uterus was also pertinent to this visit. Failing conservative tx      Plan:  I have spoken with the patient regarding treatment options including expectant management, hormonal options, or surgical intervention. Offered eval fo [place]. After a full discussion the pt elects to proceed with pt has elected to proceed with TAH / BSO  Risks discussed - see [facility] notes            No follow-ups on file.   Cortez Dines, MD

## 2023-10-30 ENCOUNTER — Encounter
Admission: RE | Admit: 2023-10-30 | Discharge: 2023-10-30 | Disposition: A | Discharge status: Home / Self Care | Source: Ambulatory Visit | Attending: Obstetrics and Gynecology | Admitting: Obstetrics and Gynecology

## 2023-10-30 ENCOUNTER — Other Ambulatory Visit: Payer: Self-pay

## 2023-10-30 DIAGNOSIS — Z01812 Encounter for preprocedural laboratory examination: Secondary | ICD-10-CM | POA: Insufficient documentation

## 2023-10-30 DIAGNOSIS — Z01818 Encounter for other preprocedural examination: Secondary | ICD-10-CM

## 2023-10-30 HISTORY — DX: Anemia, unspecified: D64.9

## 2023-10-30 HISTORY — DX: Intramural leiomyoma of uterus: D25.1

## 2023-10-30 HISTORY — DX: Excessive and frequent menstruation with regular cycle: N92.0

## 2023-10-30 LAB — BASIC METABOLIC PANEL WITH GFR
Anion gap: 7 (ref 5–15)
BUN: 12 mg/dL (ref 6–20)
CO2: 22 mmol/L (ref 22–32)
Calcium: 8.9 mg/dL (ref 8.9–10.3)
Chloride: 106 mmol/L (ref 98–111)
Creatinine, Ser: 0.86 mg/dL (ref 0.44–1.00)
GFR, Estimated: >60 mL/min (ref >60)
Glucose, Bld: 94 mg/dL (ref 70–99)
Potassium: 3.6 mmol/L (ref 3.5–5.1)
Sodium: 135 mmol/L (ref 135–145)

## 2023-10-30 LAB — CBC
HCT: 36.1 % (ref 36.0–46.0)
Hemoglobin: 11.1 g/dL — ABNORMAL LOW (ref 12.0–15.0)
MCH: 23.7 pg — ABNORMAL LOW (ref 26.0–34.0)
MCHC: 30.7 g/dL (ref 30.0–36.0)
MCV: 77 fL — ABNORMAL LOW (ref 80.0–100.0)
Platelets: 343 10*3/uL (ref 150–400)
RBC: 4.69 MIL/uL (ref 3.87–5.11)
RDW: 19.9 % — ABNORMAL HIGH (ref 11.5–15.5)
WBC: 5.3 10*3/uL (ref 4.0–10.5)
nRBC: 0 % (ref 0.0–0.2)

## 2023-10-30 LAB — TYPE AND SCREEN
ABO/RH(D): B POS
Antibody Screen: NEGATIVE

## 2023-10-30 NOTE — Patient Instructions (Addendum)
 Your procedure is scheduled on: 11/06/23 - Tuesday Report to the Registration Desk on the 1st floor of the [facility]. To find out your arrival time, please call [phone] between 1PM - 3PM on: 11/05/23 - Monday If your arrival time is 6:00 am, do not arrive before that time as the [facility] entrance doors do not open until 6:00 am.  REMEMBER: Instructions that are not followed completely may result in serious medical risk, up to and including death; or upon the discretion of your surgeon and anesthesiologist your surgery may need to be rescheduled.  Do not eat food after midnight the night before surgery.  No gum chewing or hard candies.  You may however, drink CLEAR liquids up to 2 hours before you are scheduled to arrive for your surgery. Do not drink anything within 2 hours of your scheduled arrival time.  Clear liquids include: - water  - apple juice without pulp - gatorade (not RED colors) - black coffee or tea (Do NOT add milk or creamers to the coffee or tea) Do NOT drink anything that is not on this list.  In addition, your doctor has ordered for you to drink the provided:  Ensure Pre-Surgery Clear Carbohydrate Drink  Drinking this carbohydrate drink up to two hours before surgery helps to reduce insulin resistance and improve patient outcomes. Please complete drinking 2 hours before scheduled arrival time.  One week prior to surgery: Stop taking beginning 10/30/23, Anti-inflammatories (NSAIDS) such as Advil, Aleve , Ibuprofen, Motrin, Naproxen , Naprosyn  and Aspirin based products such as Excedrin, Goody's Powder, BC Powder. You may take Tylenol if needed for pain up until the day of surgery.  Stop ANY OVER THE COUNTER supplements until after surgery beginning 06/24: BLACK CURRANT SEED OIL ,OIL OF [place] ,[place] , may resume after surgery.  Continue taking all of your other prescription medications up until the day before surgery.  ON THE DAY OF SURGERY ONLY TAKE  THESE MEDICATIONS WITH SIPS OF WATER:  none   No Alcohol for 24 hours before or after surgery.  No Smoking including e-cigarettes for 24 hours before surgery.  No chewable tobacco products for at least 6 hours before surgery.  No nicotine patches on the day of surgery.  Do not use any recreational drugs for at least a week (preferably 2 weeks) before your surgery.  Please be advised that the combination of cocaine and anesthesia may have negative outcomes, up to and including death. If you test positive for cocaine, your surgery will be cancelled.  On the morning of surgery brush your teeth with toothpaste and water, you may rinse your mouth with mouthwash if you wish. Do not swallow any toothpaste or mouthwash.  Use CHG Soap or wipes as directed on instruction sheet.  Do not wear jewelry, make-up, hairpins, clips or nail polish.  For welded (permanent) jewelry: bracelets, anklets, waist bands, etc.  Please have this removed prior to surgery.  If it is not removed, there is a chance that hospital personnel will need to cut it off on the day of surgery.  Do not wear lotions, powders, or perfumes.   Contact lenses, hearing aids and dentures may not be worn into surgery.  Do not bring valuables to the hospital. [facility] is not responsible for any missing/lost belongings or valuables.   Notify your doctor if there is any change in your medical condition (cold, fever, infection).  Wear comfortable clothing (specific to your surgery type) to the hospital.  After  surgery, you can help prevent lung complications by doing breathing exercises.  Take deep breaths and cough every 1-2 hours. Your doctor may order a device called an Incentive Spirometer to help you take deep breaths.  When coughing or sneezing, hold a pillow firmly against your incision with both hands. This is called "splinting." Doing this helps protect your incision. It also decreases belly discomfort.  If you are  being admitted to the hospital overnight, leave your suitcase in the car. After surgery it may be brought to your room.  In case of increased patient census, it may be necessary for you, the patient, to continue your postoperative care in the Same Day Surgery department.  If you are being discharged the day of surgery, you will not be allowed to drive home. You will need a responsible individual to drive you home and stay with you for 24 hours after surgery.   If you are taking public transportation, you will need to have a responsible individual with you.  Please call the Pre-admissions Testing Dept. at [phone] if you have any questions about these instructions.  Surgery Visitation Policy:  Patients having surgery or a procedure may have two visitors.  Children under the age of 42 must have an adult with them who is not the patient.  Inpatient Visitation:    Visiting hours are 7 a.m. to 8 p.m. Up to four visitors are allowed at one time in a patient room. The visitors may rotate out with other people during the day.  One visitor age 40 or older may stay with the patient overnight and must be in the room by 8 p.m.   Merchandiser, retail to address health-related social needs:  [email or web address]     Preparing for Surgery with CHLORHEXIDINE GLUCONATE (CHG) Soap  Chlorhexidine Gluconate (CHG) Soap  o An antiseptic cleaner that kills germs and bonds with the skin to continue killing germs even after washing  o Used for showering the night before surgery and morning of surgery  Before surgery, you can play an important role by reducing the number of germs on your skin.  CHG (Chlorhexidine gluconate) soap is an antiseptic cleanser which kills germs and bonds with the skin to continue killing germs even after washing.  Please do not use if you have an allergy to CHG or antibacterial soaps. If your skin becomes reddened/irritated stop using the CHG.  1.  Shower the NIGHT BEFORE SURGERY and the MORNING OF SURGERY with CHG soap.  2. If you choose to wash your hair, wash your hair first as usual with your normal shampoo.  3. After shampooing, rinse your hair and body thoroughly to remove the shampoo.  4. Use CHG as you would any other liquid soap. You can apply CHG directly to the skin and wash gently with a scrungie or a clean washcloth.  5. Apply the CHG soap to your body only from the neck down. Do not use on open wounds or open sores. Avoid contact with your eyes, ears, mouth, and genitals (private parts). Wash face and genitals (private parts) with your normal soap.  6. Wash thoroughly, paying special attention to the area where your surgery will be performed.  7. Thoroughly rinse your body with warm water.  8. Do not shower/wash with your normal soap after using and rinsing off the CHG soap.  9. Pat yourself dry with a clean towel.  10. Wear clean pajamas to bed the night before surgery.  12. Place clean  sheets on your bed the night of your first shower and do not sleep with pets.  13. Shower again with the CHG soap on the day of surgery prior to arriving at the hospital.  14. Do not apply any deodorants/lotions/powders.  15. Please wear clean clothes to the hospital.

## 2023-11-05 MED ORDER — POVIDONE-IODINE 10 % EX SWAB
2.0000 | Freq: Once | CUTANEOUS | Status: DC
Start: 2023-11-05 — End: 2023-11-06

## 2023-11-05 MED ORDER — ACETAMINOPHEN 500 MG PO TABS
1000.0000 mg | ORAL_TABLET | ORAL | Status: AC
  Administered 2023-11-06: 1000 mg via ORAL

## 2023-11-05 MED ORDER — CEFAZOLIN SODIUM-DEXTROSE 2-4 GM/100ML-% IV SOLN
2.0000 g | Freq: Once | INTRAVENOUS | Status: AC
  Administered 2023-11-06: 2 g via INTRAVENOUS

## 2023-11-05 MED ORDER — SODIUM CHLORIDE 0.9% FLUSH: 3.0000 mL | Freq: Two times a day (BID) | INTRAVENOUS | Status: DC

## 2023-11-05 MED ORDER — SODIUM CHLORIDE 0.9% FLUSH: 3.0000 mL | INTRAVENOUS | Status: DC | PRN

## 2023-11-05 MED ORDER — CHLORHEXIDINE GLUCONATE 0.12 % MT SOLN
15.0000 mL | Freq: Once | OROMUCOSAL | Status: AC
  Administered 2023-11-06: 15 mL via OROMUCOSAL

## 2023-11-05 MED ORDER — ORAL CARE MOUTH RINSE: 15.0000 mL | Freq: Once | OROMUCOSAL | Status: AC

## 2023-11-05 MED ORDER — LACTATED RINGERS IV SOLN: INTRAVENOUS | Status: DC

## 2023-11-06 ENCOUNTER — Inpatient Hospital Stay: Payer: Self-pay | Admitting: Urgent Care

## 2023-11-06 ENCOUNTER — Other Ambulatory Visit: Payer: Self-pay

## 2023-11-06 ENCOUNTER — Inpatient Hospital Stay

## 2023-11-06 ENCOUNTER — Encounter
Admission: RE | Disposition: A | Discharge status: Home / Self Care | Payer: Self-pay | Source: Home / Self Care | Attending: Obstetrics and Gynecology

## 2023-11-06 ENCOUNTER — Encounter: Payer: Self-pay | Admitting: Obstetrics and Gynecology

## 2023-11-06 ENCOUNTER — Inpatient Hospital Stay
Admission: RE | Admit: 2023-11-06 | Discharge: 2023-11-07 | DRG: 743 | Disposition: A | Discharge status: Home / Self Care | Payer: Self-pay | Attending: Obstetrics and Gynecology | Admitting: Obstetrics and Gynecology

## 2023-11-06 DIAGNOSIS — D251 Intramural leiomyoma of uterus: Secondary | ICD-10-CM | POA: Diagnosis present

## 2023-11-06 DIAGNOSIS — Z87891 Personal history of nicotine dependence: Secondary | ICD-10-CM | POA: Diagnosis not present

## 2023-11-06 DIAGNOSIS — Z01818 Encounter for other preprocedural examination: Secondary | ICD-10-CM

## 2023-11-06 DIAGNOSIS — Z1152 Encounter for screening for COVID-19: Secondary | ICD-10-CM

## 2023-11-06 DIAGNOSIS — Z8249 Family history of ischemic heart disease and other diseases of the circulatory system: Secondary | ICD-10-CM

## 2023-11-06 DIAGNOSIS — Z419 Encounter for procedure for purposes other than remedying health state, unspecified: Secondary | ICD-10-CM

## 2023-11-06 DIAGNOSIS — N920 Excessive and frequent menstruation with regular cycle: Principal | ICD-10-CM | POA: Diagnosis present

## 2023-11-06 DIAGNOSIS — Z9889 Other specified postprocedural states: Secondary | ICD-10-CM

## 2023-11-06 DIAGNOSIS — Z01812 Encounter for preprocedural laboratory examination: Secondary | ICD-10-CM

## 2023-11-06 HISTORY — PX: HYSTERECTOMY ABDOMINAL WITH SALPINGO-OOPHORECTOMY: SHX6792

## 2023-11-06 HISTORY — PX: CYSTOSCOPY: SHX5120

## 2023-11-06 LAB — POCT PREGNANCY, URINE: Preg Test, Ur: NEGATIVE

## 2023-11-06 LAB — ABO/RH: ABO/RH(D): B POS

## 2023-11-06 SURGERY — HYSTERECTOMY, ABDOMINAL, WITH SALPINGO-OOPHORECTOMY
Anesthesia: General | Site: Bladder

## 2023-11-06 MED ORDER — CEFAZOLIN SODIUM-DEXTROSE 2-4 GM/100ML-% IV SOLN
INTRAVENOUS | Status: AC
  Filled 2023-11-06: qty 100

## 2023-11-06 MED ORDER — SODIUM CHLORIDE (PF) 0.9 % IJ SOLN
INTRAMUSCULAR | Status: AC
  Filled 2023-11-06: qty 50

## 2023-11-06 MED ORDER — KETOROLAC TROMETHAMINE 30 MG/ML IJ SOLN
INTRAMUSCULAR | Status: DC | PRN
  Administered 2023-11-06: 30 mg via INTRAVENOUS

## 2023-11-06 MED ORDER — FENTANYL CITRATE (PF) 100 MCG/2ML IJ SOLN
INTRAMUSCULAR | Status: AC
Start: 2023-11-06 — End: 2023-11-06
  Filled 2023-11-06: qty 2

## 2023-11-06 MED ORDER — FENTANYL CITRATE (PF) 100 MCG/2ML IJ SOLN
25.0000 ug | INTRAMUSCULAR | Status: DC | PRN
  Administered 2023-11-06 (×2): 50 ug via INTRAVENOUS

## 2023-11-06 MED ORDER — VASOPRESSIN 20 UNIT/ML IV SOLN
INTRAVENOUS | Status: AC
  Filled 2023-11-06: qty 1

## 2023-11-06 MED ORDER — MORPHINE SULFATE 1 MG/ML IV SOLN PCA
INTRAVENOUS | Status: DC
  Filled 2023-11-06: qty 30

## 2023-11-06 MED ORDER — PROPOFOL 1000 MG/100ML IV EMUL
INTRAVENOUS | Status: AC
  Filled 2023-11-06: qty 100

## 2023-11-06 MED ORDER — ONDANSETRON HCL 4 MG PO TABS: 4.0000 mg | ORAL_TABLET | Freq: Four times a day (QID) | ORAL | Status: DC | PRN

## 2023-11-06 MED ORDER — VASOPRESSIN 20 UNIT/ML IV SOLN
INTRAVENOUS | Status: DC | PRN
  Administered 2023-11-06: 50 mL via INTRAMUSCULAR

## 2023-11-06 MED ORDER — VASOPRESSIN 20 UNIT/ML IV SOLN
INTRAVENOUS | Status: DC | PRN
  Administered 2023-11-06: 20 mL

## 2023-11-06 MED ORDER — LIDOCAINE HCL (CARDIAC) PF 100 MG/5ML IV SOSY
PREFILLED_SYRINGE | INTRAVENOUS | Status: DC | PRN
  Administered 2023-11-06: 100 mg via INTRAVENOUS

## 2023-11-06 MED ORDER — OXYCODONE HCL 5 MG PO TABS: 5.0000 mg | ORAL_TABLET | Freq: Once | ORAL | Status: DC | PRN

## 2023-11-06 MED ORDER — SODIUM CHLORIDE (PF) 0.9 % IJ SOLN
INTRAMUSCULAR | Status: DC | PRN
  Administered 2023-11-06: 90 mL via INTRAMUSCULAR

## 2023-11-06 MED ORDER — SODIUM CHLORIDE 0.9 % IV SOLN
25.0000 mg | Freq: Four times a day (QID) | INTRAVENOUS | Status: DC | PRN
  Administered 2023-11-06: 25 mg via INTRAVENOUS
  Filled 2023-11-06: qty 1

## 2023-11-06 MED ORDER — HYDROMORPHONE HCL 1 MG/ML IJ SOLN
INTRAMUSCULAR | Status: AC
  Filled 2023-11-06: qty 1

## 2023-11-06 MED ORDER — ENOXAPARIN SODIUM 40 MG/0.4ML IJ SOSY
40.0000 mg | PREFILLED_SYRINGE | INTRAMUSCULAR | Status: DC
  Administered 2023-11-07: 40 mg via SUBCUTANEOUS
  Filled 2023-11-06: qty 0.4

## 2023-11-06 MED ORDER — DIPHENHYDRAMINE HCL 12.5 MG/5ML PO ELIX: 12.5000 mg | ORAL_SOLUTION | Freq: Four times a day (QID) | ORAL | Status: DC | PRN

## 2023-11-06 MED ORDER — METHYLENE BLUE (ANTIDOTE) 1 % IV SOLN
INTRAVENOUS | Status: AC
  Filled 2023-11-06: qty 10

## 2023-11-06 MED ORDER — ACETAMINOPHEN 500 MG PO TABS
1000.0000 mg | ORAL_TABLET | Freq: Four times a day (QID) | ORAL | Status: DC
  Administered 2023-11-07 (×3): 1000 mg via ORAL
  Filled 2023-11-06 (×3): qty 2

## 2023-11-06 MED ORDER — DIPHENHYDRAMINE HCL 12.5 MG/5ML PO ELIX
12.5000 mg | ORAL_SOLUTION | Freq: Four times a day (QID) | ORAL | Status: DC | PRN
Start: 2023-11-06 — End: 2023-11-07

## 2023-11-06 MED ORDER — FENTANYL CITRATE (PF) 100 MCG/2ML IJ SOLN
INTRAMUSCULAR | Status: DC | PRN
  Administered 2023-11-06: 100 ug via INTRAVENOUS

## 2023-11-06 MED ORDER — ROCURONIUM BROMIDE 10 MG/ML (PF) SYRINGE
PREFILLED_SYRINGE | INTRAVENOUS | Status: AC
  Filled 2023-11-06: qty 10

## 2023-11-06 MED ORDER — PROPOFOL 10 MG/ML IV BOLUS
INTRAVENOUS | Status: AC
  Filled 2023-11-06: qty 20

## 2023-11-06 MED ORDER — BUPIVACAINE HCL (PF) 0.5 % IJ SOLN
INTRAMUSCULAR | Status: AC
  Filled 2023-11-06: qty 60

## 2023-11-06 MED ORDER — MORPHINE SULFATE 1 MG/ML IV SOLN PCA: INTRAVENOUS | Status: DC

## 2023-11-06 MED ORDER — KETOROLAC TROMETHAMINE 30 MG/ML IJ SOLN
INTRAMUSCULAR | Status: AC
  Filled 2023-11-06: qty 1

## 2023-11-06 MED ORDER — SUGAMMADEX SODIUM 200 MG/2ML IV SOLN
INTRAVENOUS | Status: DC | PRN
  Administered 2023-11-06: 100 mg via INTRAVENOUS
  Administered 2023-11-06: 200 mg via INTRAVENOUS

## 2023-11-06 MED ORDER — MIDAZOLAM HCL 2 MG/2ML IJ SOLN
INTRAMUSCULAR | Status: DC | PRN
  Administered 2023-11-06: 2 mg via INTRAVENOUS

## 2023-11-06 MED ORDER — FENTANYL CITRATE (PF) 100 MCG/2ML IJ SOLN
INTRAMUSCULAR | Status: AC
  Filled 2023-11-06: qty 2

## 2023-11-06 MED ORDER — DEXAMETHASONE SODIUM PHOSPHATE 10 MG/ML IJ SOLN
INTRAMUSCULAR | Status: DC | PRN
  Administered 2023-11-06: 8 mg via INTRAVENOUS

## 2023-11-06 MED ORDER — LIDOCAINE HCL (PF) 2 % IJ SOLN
INTRAMUSCULAR | Status: AC
  Filled 2023-11-06: qty 5

## 2023-11-06 MED ORDER — MORPHINE SULFATE 1 MG/ML IV SOLN PCA
INTRAVENOUS | Status: DC
  Administered 2023-11-06: 3.15 mL via INTRAVENOUS
  Administered 2023-11-06: 2.54 mg via INTRAVENOUS
  Administered 2023-11-06: 1 mg via INTRAVENOUS
  Administered 2023-11-06: 3.31 mg via INTRAVENOUS
  Administered 2023-11-06: 4.77 mL via INTRAVENOUS
  Administered 2023-11-07: 2.97 mg via INTRAVENOUS
  Administered 2023-11-07: 4.39 mg via INTRAVENOUS
  Filled 2023-11-06: qty 30

## 2023-11-06 MED ORDER — OXYCODONE HCL 5 MG PO TABS: 5.0000 mg | ORAL_TABLET | ORAL | Status: DC | PRN

## 2023-11-06 MED ORDER — NALOXONE HCL 0.4 MG/ML IJ SOLN: 0.4000 mg | INTRAMUSCULAR | Status: DC | PRN

## 2023-11-06 MED ORDER — LACTATED RINGERS IV SOLN: INTRAVENOUS | Status: AC

## 2023-11-06 MED ORDER — METRONIDAZOLE 500 MG/100ML IV SOLN
500.0000 mg | Freq: Once | INTRAVENOUS | Status: DC
  Filled 2023-11-06: qty 100

## 2023-11-06 MED ORDER — DROPERIDOL 2.5 MG/ML IJ SOLN: 0.6250 mg | Freq: Once | INTRAMUSCULAR | Status: DC | PRN

## 2023-11-06 MED ORDER — STERILE WATER FOR IRRIGATION IR SOLN
Status: DC | PRN
  Administered 2023-11-06: 1000 mL

## 2023-11-06 MED ORDER — PROPOFOL 10 MG/ML IV BOLUS
INTRAVENOUS | Status: DC | PRN
  Administered 2023-11-06: 200 mg via INTRAVENOUS

## 2023-11-06 MED ORDER — KETOROLAC TROMETHAMINE 30 MG/ML IJ SOLN
30.0000 mg | Freq: Four times a day (QID) | INTRAMUSCULAR | Status: DC | PRN
  Administered 2023-11-06: 30 mg via INTRAVENOUS
  Filled 2023-11-06: qty 1

## 2023-11-06 MED ORDER — PROPOFOL 500 MG/50ML IV EMUL
INTRAVENOUS | Status: DC | PRN
  Administered 2023-11-06: 150 ug/kg/min via INTRAVENOUS

## 2023-11-06 MED ORDER — OXYCODONE HCL 5 MG/5ML PO SOLN: 5.0000 mg | Freq: Once | ORAL | Status: DC | PRN

## 2023-11-06 MED ORDER — MIDAZOLAM HCL 2 MG/2ML IJ SOLN
INTRAMUSCULAR | Status: AC
  Filled 2023-11-06: qty 2

## 2023-11-06 MED ORDER — SODIUM CHLORIDE 0.9% FLUSH: 9.0000 mL | INTRAVENOUS | Status: DC | PRN

## 2023-11-06 MED ORDER — ROCURONIUM BROMIDE 100 MG/10ML IV SOLN
INTRAVENOUS | Status: DC | PRN
  Administered 2023-11-06 (×3): 20 mg via INTRAVENOUS
  Administered 2023-11-06: 60 mg via INTRAVENOUS
  Administered 2023-11-06: 20 mg via INTRAVENOUS

## 2023-11-06 MED ORDER — ONDANSETRON HCL 4 MG/2ML IJ SOLN
INTRAMUSCULAR | Status: DC | PRN
  Administered 2023-11-06: 4 mg via INTRAVENOUS

## 2023-11-06 MED ORDER — ONDANSETRON HCL 4 MG/2ML IJ SOLN
4.0000 mg | Freq: Four times a day (QID) | INTRAMUSCULAR | Status: DC | PRN
  Administered 2023-11-06: 4 mg via INTRAVENOUS
  Filled 2023-11-06: qty 2

## 2023-11-06 MED ORDER — HYDROMORPHONE HCL 1 MG/ML IJ SOLN
INTRAMUSCULAR | Status: DC | PRN
  Administered 2023-11-06 (×2): 1 mg via INTRAVENOUS

## 2023-11-06 MED ORDER — BUPIVACAINE LIPOSOME 1.3 % IJ SUSP
INTRAMUSCULAR | Status: AC
  Filled 2023-11-06: qty 10

## 2023-11-06 MED ORDER — DIPHENHYDRAMINE HCL 50 MG/ML IJ SOLN: 12.5000 mg | Freq: Four times a day (QID) | INTRAMUSCULAR | Status: DC | PRN

## 2023-11-06 MED ORDER — DIPHENHYDRAMINE HCL 50 MG/ML IJ SOLN
12.5000 mg | Freq: Four times a day (QID) | INTRAMUSCULAR | Status: DC | PRN
Start: 2023-11-06 — End: 2023-11-07

## 2023-11-06 MED ORDER — HEMOSTATIC AGENTS (NO CHARGE) OPTIME
TOPICAL | Status: DC | PRN
  Administered 2023-11-06: 1 via TOPICAL

## 2023-11-06 MED ORDER — ACETAMINOPHEN 500 MG PO TABS
ORAL_TABLET | ORAL | Status: AC
Start: 2023-11-06 — End: 2023-11-06
  Filled 2023-11-06: qty 2

## 2023-11-06 MED ORDER — KETAMINE HCL 10 MG/ML IJ SOLN
INTRAMUSCULAR | Status: DC | PRN
  Administered 2023-11-06 (×2): 20 mg via INTRAVENOUS
  Administered 2023-11-06: 10 mg via INTRAVENOUS

## 2023-11-06 MED ORDER — METRONIDAZOLE 500 MG/100ML IV SOLN
INTRAVENOUS | Status: DC | PRN
  Administered 2023-11-06: 500 mg via INTRAVENOUS

## 2023-11-06 MED ORDER — GABAPENTIN 100 MG PO CAPS
100.0000 mg | ORAL_CAPSULE | Freq: Every day | ORAL | Status: DC
  Administered 2023-11-06: 100 mg via ORAL
  Filled 2023-11-06: qty 1

## 2023-11-06 MED ORDER — KETAMINE HCL 50 MG/5ML IJ SOSY
PREFILLED_SYRINGE | INTRAMUSCULAR | Status: AC
  Filled 2023-11-06: qty 5

## 2023-11-06 MED ORDER — SODIUM CHLORIDE (PF) 0.9 % IJ SOLN
INTRAMUSCULAR | Status: AC
  Filled 2023-11-06: qty 20

## 2023-11-06 MED ORDER — CHLORHEXIDINE GLUCONATE 0.12 % MT SOLN
OROMUCOSAL | Status: AC
  Filled 2023-11-06: qty 15

## 2023-11-06 MED ORDER — ACETAMINOPHEN 10 MG/ML IV SOLN: 1000.0000 mg | Freq: Once | INTRAVENOUS | Status: DC | PRN

## 2023-11-06 MED ORDER — GLYCOPYRROLATE 0.2 MG/ML IJ SOLN
INTRAMUSCULAR | Status: AC
  Filled 2023-11-06: qty 1

## 2023-11-06 MED ORDER — ONDANSETRON HCL 4 MG/2ML IJ SOLN: 4.0000 mg | Freq: Four times a day (QID) | INTRAMUSCULAR | Status: DC | PRN

## 2023-11-06 SURGICAL SUPPLY — 50 items
CHLORAPREP W/TINT 26 (MISCELLANEOUS) ×3 IMPLANT
CNTNR URN SCR LID CUP LEK RST (MISCELLANEOUS) ×3 IMPLANT
COUNTER NDL MAGNETIC 40 RED (SET/KITS/TRAYS/PACK) IMPLANT
COUNTER NEEDLE MAGNETIC 40 RED (SET/KITS/TRAYS/PACK) ×2 IMPLANT
DRAPE LAP W/FLUID (DRAPES) ×3 IMPLANT
DRAPE UNDER BUTTOCK W/FLU (DRAPES) ×3 IMPLANT
DRAPE WARM FLUID 44X44 (DRAPES) IMPLANT
DRSG TELFA 3X8 NADH STRL (GAUZE/BANDAGES/DRESSINGS) ×3 IMPLANT
ELECT BLADE 6.5 EXT (BLADE) IMPLANT
ELECTRODE REM PT RTRN 9FT ADLT (ELECTROSURGICAL) ×3 IMPLANT
GAUZE 4X4 16PLY ~~LOC~~+RFID DBL (SPONGE) ×6 IMPLANT
GAUZE SPONGE 4X4 12PLY STRL (GAUZE/BANDAGES/DRESSINGS) ×3 IMPLANT
GLOVE BIO SURGEON STRL SZ7 (GLOVE) ×3 IMPLANT
GLOVE BIOGEL PI IND STRL 6.5 (GLOVE) ×3 IMPLANT
GLOVE SURG SYN 8.0 (GLOVE) ×6 IMPLANT
GLOVE SURG SYN 8.0 PF PI (GLOVE) ×3 IMPLANT
GOWN STRL REUS W/ TWL LRG LVL3 (GOWN DISPOSABLE) ×6 IMPLANT
GOWN STRL REUS W/ TWL XL LVL3 (GOWN DISPOSABLE) ×3 IMPLANT
HOLDER FOLEY CATH W/STRAP (MISCELLANEOUS) IMPLANT
IV NS 1000ML BAXH (IV SOLUTION) IMPLANT
KIT TURNOVER CYSTO (KITS) ×3 IMPLANT
LABEL OR SOLS (LABEL) ×3 IMPLANT
MANIFOLD NEPTUNE II (INSTRUMENTS) ×3 IMPLANT
MARKER SKIN DUAL TIP RULER LAB (MISCELLANEOUS) IMPLANT
NDL HYPO 22X1.5 SAFETY MO (MISCELLANEOUS) ×6 IMPLANT
NEEDLE HYPO 22X1.5 SAFETY MO (MISCELLANEOUS) ×4 IMPLANT
NS IRRIG 500ML POUR BTL (IV SOLUTION) IMPLANT
PACK BASIN MAJOR ARMC (MISCELLANEOUS) ×3 IMPLANT
POWDER SURGICEL 3.0 GRAM (HEMOSTASIS) IMPLANT
RETAINER VISCERA MED (MISCELLANEOUS) IMPLANT
SET CYSTO W/LG BORE CLAMP LF (SET/KITS/TRAYS/PACK) IMPLANT
SOLUTION PREP PVP 2OZ (MISCELLANEOUS) ×3 IMPLANT
SPONGE T-LAP 18X18 ~~LOC~~+RFID (SPONGE) ×6 IMPLANT
STAPLER INSORB 30 2030 C-SECTI (MISCELLANEOUS) IMPLANT
STAPLER SKIN PROX 35W (STAPLE) IMPLANT
SURGILUBE 2OZ TUBE FLIPTOP (MISCELLANEOUS) ×3 IMPLANT
SUT CHROMIC 2 0 CT 1 (SUTURE) ×9 IMPLANT
SUT PDS AB 1 TP1 96 (SUTURE) IMPLANT
SUT VIC AB 0 CT1 27XCR 8 STRN (SUTURE) ×6 IMPLANT
SUT VIC AB 0 CT1 36 (SUTURE) ×6 IMPLANT
SUT VIC AB 2-0 SH 27XBRD (SUTURE) IMPLANT
SUT VIC AB 3-0 SH 27X BRD (SUTURE) ×3 IMPLANT
SYR 20ML LL LF (SYRINGE) ×6 IMPLANT
SYR 30ML LL (SYRINGE) ×3 IMPLANT
SYR BULB IRRIG 60ML STRL (SYRINGE) ×3 IMPLANT
SYR CONTROL 10ML (SYRINGE) IMPLANT
SYR CONTROL 10ML LL (SYRINGE) IMPLANT
TRAP FLUID SMOKE EVACUATOR (MISCELLANEOUS) ×3 IMPLANT
TRAY FOLEY MTR SLVR 16FR STAT (SET/KITS/TRAYS/PACK) ×3 IMPLANT
WATER STERILE IRR 1000ML POUR (IV SOLUTION) ×3 IMPLANT

## 2023-11-06 NOTE — Brief Op Note (Signed)
 11/06/2023  11:21 AM  PATIENT:  Katie Padilla  53 y.o. female  PRE-OPERATIVE DIAGNOSIS:  menorrhagia, fibroid  POST-OPERATIVE DIAGNOSIS:  menorrhagia, fibroid  PROCEDURE:  Procedure(s): HYSTERECTOMY, ABDOMINAL, WITH SALPINGO-OOPHORECTOMY (Bilateral) CYSTOSCOPY (N/A)  SURGEON:  Surgeons and Role:    * Tashyra Adduci, Debby PARAS, MD - Primary    * Katura Eatherly, Beverli GAILS, MD - Assisting  PHYSICIAN ASSISTANT: Pa Student Ronita Dom  ASSISTANTS: none   ANESTHESIA:   general  EBL:  500 mL  IOF 2600 cc ou 375   BLOOD ADMINISTERED:none  DRAINS: none   LOCAL MEDICATIONS USED:  BUPIVICAINE   SPECIMEN:  Source of Specimen:  cx , uterus , bilateral fallopian tubes and ovaries   DISPOSITION OF SPECIMEN:  PATHOLOGY  COUNTS:  YES  TOURNIQUET:  * No tourniquets in log *  DICTATION: .Other Dictation: Dictation Number verbal  PLAN OF CARE: Admit to inpatient   PATIENT DISPOSITION:  PACU - hemodynamically stable.   Delay start of Pharmacological VTE agent (>24hrs) due to surgical blood loss or risk of bleeding: not applicable

## 2023-11-06 NOTE — Anesthesia Preprocedure Evaluation (Addendum)
 Anesthesia Evaluation  Patient identified by MRN, date of birth, ID band Patient awake    Reviewed: Allergy & Precautions, H&P , NPO status , Patient's Chart, lab work & pertinent test results  Airway Mallampati: III  TM Distance: >3 FB Neck ROM: full    Dental no notable dental hx.    Pulmonary former smoker   Pulmonary exam normal        Cardiovascular negative cardio ROS Normal cardiovascular exam     Neuro/Psych negative neurological ROS  negative psych ROS   GI/Hepatic negative GI ROS, Neg liver ROS,,,  Endo/Other  negative endocrine ROS    Renal/GU      Musculoskeletal   Abdominal  (+) + obese  Peds  Hematology  (+) Blood dyscrasia, anemia   Anesthesia Other Findings Past Medical History: No date: Anemia 2021: COVID-19 No date: Intramural leiomyoma of uterus No date: Menorrhagia  Past Surgical History: 03/05/2023: COLONOSCOPY WITH PROPOFOL ; N/A     Comment:  Procedure: COLONOSCOPY WITH PROPOFOL ;  Surgeon: Cindie Carlin POUR, DO;  Location: [facility];  Service:               Endoscopy;  Laterality: N/A;  9:45 am, asa 3 03/05/2023: POLYPECTOMY     Comment:  Procedure: POLYPECTOMY;  Surgeon: Cindie Carlin POUR, DO;              Location: [facility];  Service: Endoscopy;; No date: TUBAL LIGATION  BMI    Body Mass Index: 37.86 kg/m      Reproductive/Obstetrics negative OB ROS                             Anesthesia Physical Anesthesia Plan  ASA: 2  Anesthesia Plan: General ETT   Post-op Pain Management: Toradol IV (intra-op)* and Ofirmev IV (intra-op)*   Induction: Intravenous  PONV Risk Score and Plan: 2 and Ondansetron , Dexamethasone  and Midazolam  Airway Management Planned: Oral ETT  Additional Equipment:   Intra-op Plan:   Post-operative Plan: Extubation in OR  Informed Consent: I have reviewed the patients History and Physical, chart,  labs and discussed the procedure including the risks, benefits and alternatives for the proposed anesthesia with the patient or authorized representative who has indicated his/her understanding and acceptance.     Dental Advisory Given  Plan Discussed with: CRNA and Surgeon  Anesthesia Plan Comments:         Anesthesia Quick Evaluation

## 2023-11-06 NOTE — Progress Notes (Signed)
 Pt here for TAH / BSO for symptomatic fibroid UTX . Age 53  . All questions naswered . LAbs reviewed . Proceed

## 2023-11-06 NOTE — Progress Notes (Signed)
 Obstetric and Gynecology  Subjective  Katie Padilla is a 53 y.o. female No obstetric history on file. who presented on 11/06/2023 for TAH / BSO  .   DOS  TAH , BSO and cystoscopy  PCA on and working well   Objective   Vitals:   11/06/23 1754 11/06/23 1755  BP:    Pulse:    Resp: 18 18  Temp:    SpO2: 97%      Intake/Output Summary (Last 24 hours) at 11/06/2023 1805 Last data filed at 11/06/2023 1755 Gross per 24 hour  Intake 3003.4 ml  Output 1375 ml  Net 1628.4 ml    General: NAD Cardiovascular: RRR, no murmurs Pulmonary: CTAB Abdomen: Benign. Non-tender, +BS, no guarding. Extremities: No erythema or cords, no calf tenderness, +warmth with normal peripheral pulses.  Labs: Results for orders placed or performed during the hospital encounter of 11/06/23 (from the past 24 hours)  Pregnancy, urine POC     Status: None   Collection Time: 11/06/23  6:37 AM  Result Value Ref Range   Preg Test, Ur NEGATIVE NEGATIVE  ABO/Rh     Status: None   Collection Time: 11/06/23  6:47 AM  Result Value Ref Range   ABO/RH(D)      B POS Performed at [facility], [address], [place], [place] [address]     Cultures: Results for orders placed or performed during the hospital encounter of 02/15/20  Novel Coronavirus, NAA ([facility])     Status: Abnormal   Collection Time: 02/15/20 12:25 PM   Specimen: Nasopharyngeal(NP) swabs in vial transport medium   Nasopharynge  Patient  Result Value Ref Range Status   SARS-CoV-2, NAA Detected (A) Not Detected Final    Comment: Patients who have a positive COVID-19 test result may now have treatment options. Treatment options are available for patients with mild to moderate symptoms and for hospitalized patients. Visit our website at [email or web address] for resources and information. This nucleic acid amplification test was developed and its performance characteristics determined by [employer]. Nucleic  acid amplification tests include RT-PCR and TMA. This test has not been FDA cleared or approved. This test has been authorized by FDA under an Emergency Use Authorization (EUA). This test is only authorized for the duration of time the declaration that circumstances exist justifying the authorization of the emergency use of in vitro diagnostic tests for detection of SARS-CoV-2 virus and/or diagnosis of COVID-19 infection under section 564(b)(1) of the Act, 21 U.S.C. 639aaa-6(a) (1), unless the authorization is terminated or revoked sooner. When diagnostic testing is negativ e, the possibility of a false negative result should be considered in the context of a patient's recent exposures and the presence of clinical signs and symptoms consistent with COVID-19. An individual without symptoms of COVID-19 and who is not shedding SARS-CoV-2 virus would expect to have a negative (not detected) result in this assay.   SARS-COV-2, NAA 2 DAY TAT     Status: None   Collection Time: 02/15/20 12:25 PM   Nasopharynge  Patient  Result Value Ref Range Status   SARS-CoV-2, NAA 2 DAY TAT Performed  Final    Imaging: No results found.   Assessment   53 y.o. No obstetric history on file. Hospital Day: 1 DOS - stable   Plan   1. Cont PCA and foley - remove both at 0600 tomorrow 2. Clear liquids  Encourage IS Repeat labs in am

## 2023-11-06 NOTE — Anesthesia Postprocedure Evaluation (Signed)
 Anesthesia Post Note  Patient: Francina LITTIE Browner  Procedure(s) Performed: HYSTERECTOMY, ABDOMINAL, WITH SALPINGO-OOPHORECTOMY (Bilateral: Abdomen) CYSTOSCOPY (Bladder)  Patient location during evaluation: PACU Anesthesia Type: General Level of consciousness: awake and alert Pain management: pain level controlled Vital Signs Assessment: post-procedure vital signs reviewed and stable Respiratory status: spontaneous breathing, nonlabored ventilation and respiratory function stable Cardiovascular status: blood pressure returned to baseline and stable Postop Assessment: no apparent nausea or vomiting Anesthetic complications: no   No notable events documented.   Last Vitals:  Vitals:   11/06/23 1340 11/06/23 1351  BP:    Pulse:    Resp: (P) 14 14  Temp:    SpO2:      Last Pain:  Vitals:   11/06/23 1351  TempSrc:   PainSc: 8                  Camellia Merilee Louder

## 2023-11-06 NOTE — Op Note (Signed)
 Katie Padilla, [facility] MEDICAL RECORD NO: [id number] ACCOUNT NO: [id number] DATE OF BIRTH: [birth year] FACILITY: [facility] LOCATION: [facility] PHYSICIAN: Debby DOROTHA Dinsmore, MD  Operative Report   DATE OF PROCEDURE: 11/06/2023  PREOPERATIVE DIAGNOSES: 1.  Symptomatic fibroid uterus. 2.  Menorrhagia.  POSTOPERATIVE DIAGNOSES: 1.  Symptomatic fibroid uterus. 2.  Menorrhagia.  PROCEDURE: 1.  Total abdominal hysterectomy. 2.  Bilateral salpingo-oophorectomy. 3.  Cystoscopy.  SURGEON:  Debby DOROTHA Dinsmore, MD  FIRST ASSISTANT:  Beverli Dinsmore, MD  SECOND ASSISTANT:  PA student, Caryl.  ANESTHESIA:  General endotracheal anesthesia.  INDICATIONS:  A 53 year old gravida 2, para 2 patient with a long history of menorrhagia, unresponsive to conservative therapy.  The patient underwent an ultrasound that demonstrated an extremely large uterus with multiple fibroids.  DESCRIPTION OF PROCEDURE:  After adequate general endotracheal anesthesia, the patient was placed in the dorsal supine position.  Legs in the [place] stirrups.  The patient's abdomen, perineum, and vagina were prepped and draped in normal sterile fashion.   A timeout was performed.  The patient did receive 2 g of IV Ancef and 500 mg of Flagyl intravenously for surgical prophylaxis.  A Pfannenstiel incision was made 2 fingerbreadths above the symphysis pubis.  Sharp dissection was used to identify the  fascia.  The fascia was opened in the midline and opened in a transverse fashion.  The superior aspect of the fascia was grasped with Kocher clamps and the rectus muscles were dissected free.  The inferior aspect of the fascia was grasped with Kocher  clamps and pyramidalis muscles dissected free.  Entry into the peritoneal cavity was accomplished sharply.  A large multilobed uterus was then elevated through the incision.  Given the girth and the size of the uterus, limited view of the  infundibulopelvic ligament could  be identified.  The left broad ligament was identified and the left infundibulopelvic ligament was doubly clamped, transected, and suture ligated with 0 Vicryl suture.  The rest of the visualization was limited at this  point.  Therefore, at least three large fibroids were identified, the size of at least 7 cm each.  These were injected with a vasopressin injection of 20 mL of vasopressin and 50 mL of saline.  These three large fibroids were then sharply and bluntly  dissected out of the uterus to decompress the uterus so visualization could be identified.  Once this occurred, the uterus was placed back into the abdominal cavity and the O'Connor-O'Sullivan retractor was brought up to the operative field.  The broad  ligament was incised along the bladder reflection to the midline on both sides.  The bladder was sharply and bluntly dissected off the lower uterine segment.  The left infundibulopelvic ligament was doubly clamped and the ovary and uterus were removed  separately from the uterus.  The uterine arteries were then bilaterally clamped, transected, and suture ligated with 0 Vicryl suture.  The uterus was then amputated at this point above the previously placed sutures.  This was done to aid in visualization  to remove the cervix.  Straight Heaney-Ballantine clamps were used to dissect the cervix from the vaginal cuff and ultimately, the vagina was entered and the cervical stump was removed intact.  The vaginal cuff was then closed with interrupted 0 Vicryl  suture.  Good hemostasis noted.  Several 2-0 Vicryl sutures were used for small oozing that was noted.  The right ovarian fossa showed some oozing.  This was controlled with 2-0 Vicryl figure-of-eight sutures.  Surgicel was placed  in the fossa and then  the peritoneum was closed over top of this with a 2-0 running Vicryl suture.  The patient's abdomen was then copiously irrigated with normal saline.  Good hemostasis noted.  All laparotomy sponges  and O'Connor-O'Sullivan retractors were removed.  Sponge  and needle count were correct.  Fascia was then closed with 0 Vicryl suture in a running nonlocking fashion.  Two separate sutures used.  The fascia was then injected with a solution of 10 mL of Exparel plus 60 mL of 0.5% Marcaine plus 20 mL of normal  saline.  The fascial edges were injected with a solution.  Subcutaneous tissues were irrigated and Bovie for hemostasis and, given the depth of subcutaneous tissues of 3 cm, the dead space was closed with a running 2-0 chromic suture and the skin was  reapproximated with Insorb absorbable staples.  An additional 30 mL of the Exparel/Marcaine solution was injected beneath the skin.  Good hemostasis was noted.  Good cosmetic effect noted.  Cystoscopy was then performed with removal of the Foley, filling  the bladder, and each ureteral orifice was identified with normal efflux of urine.  No other defects were noted.  Cystoscopy was then terminated and the Foley catheter was replaced.  There were no complications.  ESTIMATED BLOOD LOSS:  500 mL.  INTRAOPERATIVE FLUIDS:  2600 mL.  URINE OUTPUT:  375 mL.  DISPOSITION:  The patient did receive 30 mg of intravenous Toradol at the end of the procedure and was taken to the recovery room in good condition.   PUS D: 11/06/2023 11:56:35 am T: 11/06/2023 4:07:00 pm  JOB: [id number]/ [id number]

## 2023-11-06 NOTE — Plan of Care (Signed)
 New admission to unit at 11/06/23 @1250 

## 2023-11-06 NOTE — Transfer of Care (Addendum)
 Immediate Anesthesia Transfer of Care Note  Patient: Katie Padilla  Procedure(s) Performed: HYSTERECTOMY, ABDOMINAL, WITH SALPINGO-OOPHORECTOMY (Bilateral: Abdomen) CYSTOSCOPY (Bladder)  Patient Location: PACU  Anesthesia Type:General  Level of Consciousness: drowsy and patient cooperative  Airway & Oxygen Therapy: Patient Spontanous Breathing and Patient connected to face mask oxygen  Post-op Assessment: Report given to RN and Post -op Vital signs reviewed and stable  Post vital signs: Reviewed and stable  Last Vitals:  Vitals Value Taken Time  BP 122/67 11/06/23 11:45  Temp    Pulse 78 11/06/23 11:47  Resp 15 11/06/23 11:47  SpO2 98 % 11/06/23 11:47  Vitals shown include unfiled device data.  Last Pain:  Vitals:   11/06/23 0634  PainSc: 5          Complications: No notable events documented.

## 2023-11-06 NOTE — Anesthesia Procedure Notes (Signed)
 Procedure Name: Intubation Date/Time: 11/06/2023 7:57 AM  Performed by: Brien Sotero PARAS, CRNAPre-anesthesia Checklist: Patient identified, Emergency Drugs available, Suction available and Patient being monitored Patient Re-evaluated:Patient Re-evaluated prior to induction Oxygen Delivery Method: Circle system utilized Preoxygenation: Pre-oxygenation with 100% oxygen Induction Type: IV induction Ventilation: Mask ventilation without difficulty Laryngoscope Size: Mac, McGrath and 3 Grade View: Grade I Tube type: Oral Number of attempts: 1 Airway Equipment and Method: Stylet Placement Confirmation: ETT inserted through vocal cords under direct vision, positive ETCO2 and breath sounds checked- equal and bilateral Secured at: 21 cm Tube secured with: Tape Dental Injury: Teeth and Oropharynx as per pre-operative assessment

## 2023-11-07 ENCOUNTER — Other Ambulatory Visit

## 2023-11-07 ENCOUNTER — Encounter: Payer: Self-pay | Admitting: Obstetrics and Gynecology

## 2023-11-07 LAB — BASIC METABOLIC PANEL WITH GFR
Anion gap: 6 (ref 5–15)
BUN: 6 mg/dL (ref 6–20)
CO2: 26 mmol/L (ref 22–32)
Calcium: 9 mg/dL (ref 8.9–10.3)
Chloride: 108 mmol/L (ref 98–111)
Creatinine, Ser: 0.75 mg/dL (ref 0.44–1.00)
GFR, Estimated: >60 mL/min (ref >60)
Glucose, Bld: 106 mg/dL — ABNORMAL HIGH (ref 70–99)
Potassium: 4.4 mmol/L (ref 3.5–5.1)
Sodium: 140 mmol/L (ref 135–145)

## 2023-11-07 LAB — CBC
HCT: 36.3 % (ref 36.0–46.0)
Hemoglobin: 11 g/dL — ABNORMAL LOW (ref 12.0–15.0)
MCH: 23.6 pg — ABNORMAL LOW (ref 26.0–34.0)
MCHC: 30.3 g/dL (ref 30.0–36.0)
MCV: 77.7 fL — ABNORMAL LOW (ref 80.0–100.0)
Platelets: 279 10*3/uL (ref 150–400)
RBC: 4.67 MIL/uL (ref 3.87–5.11)
RDW: 19.4 % — ABNORMAL HIGH (ref 11.5–15.5)
WBC: 9.4 10*3/uL (ref 4.0–10.5)
nRBC: 0 % (ref 0.0–0.2)

## 2023-11-07 MED ORDER — ONDANSETRON HCL 4 MG PO TABS: 4.0000 mg | ORAL_TABLET | Freq: Every day | ORAL | 1 refills | Status: AC | PRN

## 2023-11-07 MED ORDER — IBUPROFEN 800 MG PO TABS: 800.0000 mg | ORAL_TABLET | Freq: Three times a day (TID) | ORAL | 0 refills | Status: AC | PRN

## 2023-11-07 MED ORDER — GABAPENTIN 300 MG PO CAPS: 300.0000 mg | ORAL_CAPSULE | Freq: Every day | ORAL | 0 refills | Status: AC

## 2023-11-07 NOTE — Discharge Instructions (Signed)
 Discharge instructions:    Call office if you have any of the following:  headache, visual changes, fever >101.0 F, chills, excessive vaginal bleeding, incision drainage or problems, leg pain or redness, depression or any other concerns.    Activity: Do not lift > 10 lbs for 6 weeks.  No driving for 1-2 weeks or while taking pain medication. No strenuous activity or heavy lifting for 6 weeks.  No swimming pools, hot tubs or tub baths- showers only.

## 2023-11-07 NOTE — Progress Notes (Signed)
 Patient tolerated sandwich and fries for dinner, no nausea, denies pain at this time. Elyn Sharps, RN 11/07/23 @1708 

## 2023-11-07 NOTE — Discharge Summary (Signed)
 Physician Discharge Summary  Patient ID: Katie Padilla MRN: [id number] DOB/AGE: 10-May-1970 53 y.o.  Admit date: 11/06/2023 Discharge date: 11/07/2023  Admission Diagnoses:symptomatic fibroid uterus , menorrhagia   Discharge Diagnoses:  Principal Problem:   Postoperative state   Discharged Condition: good  Hospital Course: underwent a TAH / BSO , cystoscopy . PCA first 16 hrs . Minimal pain + ambulation , + flatus tolerated regular diet    Consults: None  Significant Diagnostic Studies:  Results for orders placed or performed during the hospital encounter of 11/06/23 (from the past 24 hours)  CBC     Status: Abnormal   Collection Time: 11/07/23  4:56 AM  Result Value Ref Range   WBC 9.4 4.0 - 10.5 K/uL   RBC 4.67 3.87 - 5.11 MIL/uL   Hemoglobin 11.0 (L) 12.0 - 15.0 g/dL   HCT 63.6 63.9 - 53.9 %   MCV 77.7 (L) 80.0 - 100.0 fL   MCH 23.6 (L) 26.0 - 34.0 pg   MCHC 30.3 30.0 - 36.0 g/dL   RDW 80.5 (H) 88.4 - 84.4 %   Platelets 279 150 - 400 K/uL   nRBC 0.0 0.0 - 0.2 %  Basic metabolic panel     Status: Abnormal   Collection Time: 11/07/23  4:56 AM  Result Value Ref Range   Sodium 140 135 - 145 mmol/L   Potassium 4.4 3.5 - 5.1 mmol/L   Chloride 108 98 - 111 mmol/L   CO2 26 22 - 32 mmol/L   Glucose, Bld 106 (H) 70 - 99 mg/dL   BUN 6 6 - 20 mg/dL   Creatinine, Ser 9.24 0.44 - 1.00 mg/dL   Calcium 9.0 8.9 - 89.6 mg/dL   GFR, Estimated >39 >39 mL/min   Anion gap 6 5 - 15     Treatments: surgery: as above   Discharge Exam: Blood pressure (!) 140/84, pulse 80, temperature 98.6 F (37 C), temperature source Oral, resp. rate 18, height 5' 4.5 (1.638 m), weight 101.6 kg, last menstrual period 08/19/2023, SpO2 100%. General appearance: alert Neck: no adenopathy, no carotid bruit, no JVD, supple, symmetrical, trachea midline, and thyroid  not enlarged, symmetric, no tenderness/mass/nodules Resp: clear to auscultation bilaterally Cardio: regular rate and rhythm, S1, S2  normal, no murmur, click, rub or gallop GI: soft, non-tender; bowel sounds normal; no masses,  no organomegaly Abdomen : sogt , + BS . Incision small old drainage  Disposition:    Allergies as of 11/07/2023   No Known Allergies      Medication List     STOP taking these medications    levonorgestrel-ethinyl estradiol 0.15-0.03 MG tablet Commonly known as: SEASONALE       TAKE these medications    BLACK CURRANT SEED OIL PO Take 2 capsules by mouth every other day.   ferrous sulfate 325 (65 FE) MG tablet Take 324 mg by mouth in the morning.   gabapentin 300 MG capsule Commonly known as: Neurontin Take 1 capsule (300 mg total) by mouth at bedtime for 10 days.   GOODYS EXTRA STRENGTH PO Take 1 packet by mouth every other day as needed (pain.).   ibuprofen 800 MG tablet Commonly known as: ADVIL Take 1 tablet (800 mg total) by mouth every 8 (eight) hours as needed.   OIL OF OREGANO PO Take 1 capsule by mouth every other day.   ondansetron  4 MG tablet Commonly known as: Zofran  Take 1 tablet (4 mg total) by mouth daily as needed for nausea or  vomiting.   OVER THE COUNTER MEDICATION Take 1 capsule by mouth in the morning. Sea Moss Supplement         Signed: Debby PARAS Arend Bahl 11/07/2023, 2:53 PM

## 2023-11-07 NOTE — Plan of Care (Signed)
 Discharge instructions, when to follow up, and prescriptions reviewed with patient.  Patient verbalized understanding. Elyn Sharps, RN 11/07/23 @1558 

## 2023-11-08 LAB — SURGICAL PATHOLOGY

## 2024-05-06 ENCOUNTER — Other Ambulatory Visit: Payer: Self-pay | Admitting: Family Medicine

## 2024-05-06 DIAGNOSIS — Z1231 Encounter for screening mammogram for malignant neoplasm of breast: Secondary | ICD-10-CM

## 2024-05-09 ENCOUNTER — Ambulatory Visit: Admission: EM | Admit: 2024-05-09 | Discharge: 2024-05-09 | Disposition: A | Discharge status: Home / Self Care

## 2024-05-09 ENCOUNTER — Encounter: Payer: Self-pay | Admitting: Emergency Medicine

## 2024-05-09 ENCOUNTER — Other Ambulatory Visit: Payer: Self-pay

## 2024-05-09 DIAGNOSIS — B029 Zoster without complications: Secondary | ICD-10-CM | POA: Diagnosis not present

## 2024-05-09 MED ORDER — VALACYCLOVIR HCL 1 G PO TABS: 1000.0000 mg | ORAL_TABLET | Freq: Three times a day (TID) | ORAL | 0 refills | Status: AC

## 2024-05-09 NOTE — ED Triage Notes (Addendum)
 Pt reports right sided burning sensation, and scalp soreness, intermittent pain since Tuesday.  Intermittent bilateral floaters since this am. Pt alert and oriented. NAD noted.

## 2024-05-09 NOTE — ED Provider Notes (Signed)
 " [facility]    CSN: [id number] Arrival date & time: 05/09/24  1545      History   Chief Complaint Chief Complaint  Patient presents with   Headache    HPI Katie Padilla is a 53 y.o. female.   The history is provided by the patient.   Patient presents for complaints of a burning sensation and pain to the right side of her scalp, behind the right ear, and on the right neck.  She states that symptoms started over the past 3 days.  She states that her scalp is sore.  States that she also had noticed some floaters in her eyes this morning.  She states that she did have a rash on her chest prior to her symptoms starting but that has since improved.  She denies fever, chills, chest pain, loss of vision, lightheadedness, loss of hearing, or dizziness.  Reports prior history of chickenpox.  Past Medical History:  Diagnosis Date   Anemia    COVID-19 2021   Intramural leiomyoma of uterus    Menorrhagia     Patient Active Problem List   Diagnosis Date Noted   Postoperative state 11/06/2023    Past Surgical History:  Procedure Laterality Date   COLONOSCOPY WITH PROPOFOL  N/A 03/05/2023   Procedure: COLONOSCOPY WITH PROPOFOL ;  Surgeon: Cindie Carlin POUR, DO;  Location: [facility];  Service: Endoscopy;  Laterality: N/A;  9:45 am, asa 3   CYSTOSCOPY N/A 11/06/2023   Procedure: CYSTOSCOPY;  Surgeon: Schermerhorn, Debby PARAS, MD;  Location: [facility];  Service: Gynecology;  Laterality: N/A;   HYSTERECTOMY ABDOMINAL WITH SALPINGO-OOPHORECTOMY Bilateral 11/06/2023   Procedure: HYSTERECTOMY, ABDOMINAL, WITH SALPINGO-OOPHORECTOMY;  Surgeon: Schermerhorn, Debby PARAS, MD;  Location: [facility];  Service: Gynecology;  Laterality: Bilateral;   POLYPECTOMY  03/05/2023   Procedure: POLYPECTOMY;  Surgeon: Cindie Carlin POUR, DO;  Location: [facility];  Service: Endoscopy;;   TUBAL LIGATION      OB History   No obstetric history on file.      Home Medications    Prior to  Admission medications  Medication Sig Start Date End Date Taking? Authorizing Provider  cyclobenzaprine (FLEXERIL) 5 MG tablet TAKE 1 TO 2 TABLETS BY MOUTH ONCE DAILY AT NIGHT AT BEDTIME AS NEEDED 02/25/24  Yes [provider]  estradiol (VIVELLE-DOT) 0.0375 MG/24HR Place 1 patch onto the skin. 12/10/23 12/09/24 Yes [provider]  oxyCODONE -acetaminophen  (PERCOCET/ROXICET) 5-325 MG tablet Take 1 tablet by mouth. 11/07/23  Yes [provider]  testosterone (ANDROGEL) 50 MG/5GM (1%) GEL  05/05/24  Yes [provider]  valACYclovir (VALTREX) 1000 MG tablet Take 1 tablet (1,000 mg total) by mouth 3 (three) times daily. 05/09/24  Yes Leath-Warren, Etta PARAS, NP  Aspirin-Acetaminophen -Caffeine (GOODYS EXTRA STRENGTH PO) Take 1 packet by mouth every other day as needed (pain.).    [provider]  Black Cohosh (RA BLACK COHOSH) 200 MG CAPS Take by mouth.    [provider]  BLACK CURRANT SEED OIL PO Take 2 capsules by mouth every other day.    [provider]  ferrous sulfate 325 (65 FE) MG tablet Take 324 mg by mouth in the morning.    [provider]  gabapentin  (NEURONTIN ) 300 MG capsule Take 1 capsule (300 mg total) by mouth at bedtime for 10 days. 11/07/23 11/17/23  Schermerhorn, Debby PARAS, MD  ibuprofen  (ADVIL ) 800 MG tablet Take 1 tablet (800 mg total) by mouth every 8 (eight) hours as needed.  11/07/23   Schermerhorn, Debby PARAS, MD  OIL OF OREGANO PO Take 1 capsule by mouth every other day.    [provider]  ondansetron  (ZOFRAN ) 4 MG tablet Take 1 tablet (4 mg total) by mouth daily as needed for nausea or vomiting. 11/07/23 11/06/24  Schermerhorn, Debby PARAS, MD  OVER THE COUNTER MEDICATION Take 1 capsule by mouth in the morning. Sea Moss Supplement    [provider]    Family History Family History  Problem Relation Age of Onset   Heart failure Mother    Hypertension Mother    Diabetes Mother    Healthy Father     Breast cancer Neg Hx     Social History Social History[1]   Allergies   Patient has no known allergies.   Review of Systems Review of Systems Per HPI  Physical Exam Triage Vital Signs ED Triage Vitals  Encounter Vitals Group     BP 05/09/24 1658 129/83     Girls Systolic BP Percentile --      Girls Diastolic BP Percentile --      Boys Systolic BP Percentile --      Boys Diastolic BP Percentile --      Pulse Rate 05/09/24 1658 90     Resp 05/09/24 1658 20     Temp 05/09/24 1658 98.3 F (36.8 C)     Temp Source 05/09/24 1658 Oral     SpO2 05/09/24 1658 96 %     Weight --      Height --      Head Circumference --      Peak Flow --      Pain Score 05/09/24 1655 10     Pain Loc --      Pain Education --      Exclude from Growth Chart --    No data found.  Updated Vital Signs BP 129/83 (BP Location: Right Arm)   Pulse 90   Temp 98.3 F (36.8 C) (Oral)   Resp 20   LMP 08/19/2023   SpO2 96%   Visual Acuity Right Eye Distance:   Left Eye Distance:   Bilateral Distance:    Right Eye Near:   Left Eye Near:    Bilateral Near:     Physical Exam Vitals and nursing note reviewed.  Constitutional:      General: She is not in acute distress.    Appearance: Normal appearance.  HENT:     Head: Normocephalic.     Right Ear: Tympanic membrane, ear canal and external ear normal.     Left Ear: Tympanic membrane, ear canal and external ear normal.     Ears:     Comments: Sensitivity noted to the right ear with use of the otoscope.    Nose: Nose normal.     Mouth/Throat:     Mouth: Mucous membranes are moist.  Eyes:     Extraocular Movements: Extraocular movements intact.     Conjunctiva/sclera: Conjunctivae normal.     Pupils: Pupils are equal, round, and reactive to light.  Cardiovascular:     Rate and Rhythm: Normal rate and regular rhythm.     Pulses: Normal pulses.     Heart sounds: Normal heart sounds.  Pulmonary:     Effort: Pulmonary effort is normal.  No respiratory distress.     Breath sounds: Normal breath sounds. No stridor. No wheezing, rhonchi or rales.  Abdominal:     General: Bowel sounds are normal.  Palpations: Abdomen is soft.  Musculoskeletal:     Cervical back: Normal range of motion.  Skin:    General: Skin is warm and dry.     Findings: Erythema and rash (Erythema noted to the posterior right neck.) present.  Neurological:     General: No focal deficit present.     Mental Status: She is alert and oriented to person, place, and time.  Psychiatric:        Mood and Affect: Mood normal.        Behavior: Behavior normal.      UC Treatments / Results  Labs (all labs ordered are listed, but only abnormal results are displayed) Labs Reviewed - No data to display  EKG   Radiology No results found.  Procedures Procedures (including critical care time)  Medications Ordered in UC Medications - No data to display  Initial Impression / Assessment and Plan / UC Course  I have reviewed the triage vital signs and the nursing notes.  Pertinent labs & imaging results that were available during my care of the patient were reviewed by me and considered in my medical decision making (see chart for details).  Presents for complaints of a burning sensation of the scalp with soreness to palpation that has been present for the past 2 days.  On exam, she does not have any obvious rash, but the areas are sensitive to palpation.  She also has discomfort with use of the otoscope in the right ear.  Symptoms are consistent with shingles.  Will treat with Valtrex 1000 mg 3 times daily.  Patient states that she has gabapentin  at home.  Supportive care recommendations were provided and discussed with the patient to include over-the-counter analgesics, cool compresses, and to monitor for worsening symptoms.  Patient was advised if she develops visual changes, recommend follow-up in the emergency department immediately for further  evaluation.  Patient advised to follow-up with her PCP once her symptoms have improved to discuss shingles vaccines.  Patient was in agreement with this plan of care and verbalizes understanding.  All questions were answered.  Patient stable for discharge.  Final Clinical Impressions(s) / UC Diagnoses   Final diagnoses:  Herpes zoster without complication     Discharge Instructions      Your symptoms appear to be consistent with shingles. Take medication as prescribed.  Begin taking the gabapentin  that you have for pain. May take over-the-counter Tylenol  as needed for pain, fever, general discomfort. Increase fluids and allow for plenty of rest. May cover the areas on your chest with cool cloths to help with itching, pain, or discomfort. Do not scratch or irritate the areas while symptoms persist. If the areas begin to ooze or blister, keep them covered. Please follow-up with your PCP to discuss administration of the shingles vaccine. Follow-up as needed.      ED Prescriptions     Medication Sig Dispense Auth. Provider   valACYclovir (VALTREX) 1000 MG tablet Take 1 tablet (1,000 mg total) by mouth 3 (three) times daily. 21 tablet Leath-Warren, Etta PARAS, NP      PDMP not reviewed this encounter.    [1]  Social History Tobacco Use   Smoking status: Former    Current packs/day: 0.50    Average packs/day: 0.5 packs/day for 5.0 years (2.5 ttl pk-yrs)    Types: Cigarettes   Smokeless tobacco: Never  Vaping Use   Vaping status: Never Used     Gilmer Etta PARAS, NP 05/09/24 1717  "

## 2024-05-09 NOTE — Discharge Instructions (Addendum)
 Your symptoms appear to be consistent with shingles. Take medication as prescribed.  Begin taking the gabapentin  that you have for pain. May take over-the-counter Tylenol  as needed for pain, fever, general discomfort. Increase fluids and allow for plenty of rest. May cover the areas on your chest with cool cloths to help with itching, pain, or discomfort. Do not scratch or irritate the areas while symptoms persist. If the areas begin to ooze or blister, keep them covered. Please follow-up with your PCP to discuss administration of the shingles vaccine. Follow-up as needed.
